# Patient Record
Sex: Female | Born: 1935 | Race: White | Hispanic: No | Marital: Married | Smoking: Never smoker
Health system: Southern US, Community
[De-identification: ages and names within clinical notes are randomized; demographics above are authoritative.]

## PROBLEM LIST (undated history)

## (undated) DIAGNOSIS — E559 Vitamin D deficiency, unspecified: Secondary | ICD-10-CM

## (undated) DIAGNOSIS — F0390 Unspecified dementia without behavioral disturbance: Secondary | ICD-10-CM

## (undated) DIAGNOSIS — R5383 Other fatigue: Secondary | ICD-10-CM

## (undated) DIAGNOSIS — E785 Hyperlipidemia, unspecified: Secondary | ICD-10-CM

## (undated) DIAGNOSIS — M549 Dorsalgia, unspecified: Secondary | ICD-10-CM

## (undated) DIAGNOSIS — I10 Essential (primary) hypertension: Secondary | ICD-10-CM

## (undated) DIAGNOSIS — D649 Anemia, unspecified: Secondary | ICD-10-CM

## (undated) HISTORY — PX: CHOLECYSTECTOMY: SHX55

## (undated) HISTORY — PX: ABDOMINAL HYSTERECTOMY: SHX81

---

## 2010-05-25 ENCOUNTER — Ambulatory Visit: Payer: Medicare Other | Attending: Family Medicine | Admitting: Physical Therapy

## 2010-05-25 DIAGNOSIS — M25619 Stiffness of unspecified shoulder, not elsewhere classified: Secondary | ICD-10-CM | POA: Insufficient documentation

## 2010-05-25 DIAGNOSIS — M25519 Pain in unspecified shoulder: Secondary | ICD-10-CM | POA: Insufficient documentation

## 2010-05-25 DIAGNOSIS — IMO0001 Reserved for inherently not codable concepts without codable children: Secondary | ICD-10-CM | POA: Insufficient documentation

## 2010-05-30 ENCOUNTER — Ambulatory Visit: Payer: Medicare Other | Attending: Family Medicine | Admitting: Physical Therapy

## 2010-05-30 DIAGNOSIS — IMO0001 Reserved for inherently not codable concepts without codable children: Secondary | ICD-10-CM | POA: Insufficient documentation

## 2010-05-30 DIAGNOSIS — M25619 Stiffness of unspecified shoulder, not elsewhere classified: Secondary | ICD-10-CM | POA: Insufficient documentation

## 2010-05-30 DIAGNOSIS — M25519 Pain in unspecified shoulder: Secondary | ICD-10-CM | POA: Insufficient documentation

## 2010-05-31 ENCOUNTER — Ambulatory Visit: Payer: Medicare Other

## 2010-06-06 ENCOUNTER — Ambulatory Visit: Payer: Medicare Other | Admitting: Physical Therapy

## 2010-06-08 ENCOUNTER — Ambulatory Visit: Payer: Medicare Other | Admitting: Physical Therapy

## 2010-06-13 ENCOUNTER — Ambulatory Visit: Payer: Medicare Other | Admitting: Physical Therapy

## 2010-06-15 ENCOUNTER — Ambulatory Visit: Payer: Medicare Other | Admitting: Physical Therapy

## 2011-12-12 ENCOUNTER — Encounter (HOSPITAL_BASED_OUTPATIENT_CLINIC_OR_DEPARTMENT_OTHER): Payer: Self-pay

## 2011-12-12 ENCOUNTER — Emergency Department (HOSPITAL_BASED_OUTPATIENT_CLINIC_OR_DEPARTMENT_OTHER)
Admission: EM | Admit: 2011-12-12 | Discharge: 2011-12-12 | Disposition: A | Discharge status: Home / Self Care | Payer: Medicare Other | Attending: Emergency Medicine | Admitting: Emergency Medicine

## 2011-12-12 DIAGNOSIS — Z79899 Other long term (current) drug therapy: Secondary | ICD-10-CM | POA: Insufficient documentation

## 2011-12-12 DIAGNOSIS — Z7982 Long term (current) use of aspirin: Secondary | ICD-10-CM | POA: Insufficient documentation

## 2011-12-12 DIAGNOSIS — K529 Noninfective gastroenteritis and colitis, unspecified: Secondary | ICD-10-CM

## 2011-12-12 DIAGNOSIS — F068 Other specified mental disorders due to known physiological condition: Secondary | ICD-10-CM | POA: Insufficient documentation

## 2011-12-12 DIAGNOSIS — R197 Diarrhea, unspecified: Secondary | ICD-10-CM | POA: Insufficient documentation

## 2011-12-12 DIAGNOSIS — Z8639 Personal history of other endocrine, nutritional and metabolic disease: Secondary | ICD-10-CM | POA: Insufficient documentation

## 2011-12-12 DIAGNOSIS — K5289 Other specified noninfective gastroenteritis and colitis: Secondary | ICD-10-CM | POA: Insufficient documentation

## 2011-12-12 DIAGNOSIS — E785 Hyperlipidemia, unspecified: Secondary | ICD-10-CM | POA: Insufficient documentation

## 2011-12-12 DIAGNOSIS — I10 Essential (primary) hypertension: Secondary | ICD-10-CM | POA: Insufficient documentation

## 2011-12-12 DIAGNOSIS — Z862 Personal history of diseases of the blood and blood-forming organs and certain disorders involving the immune mechanism: Secondary | ICD-10-CM | POA: Insufficient documentation

## 2011-12-12 HISTORY — DX: Vitamin D deficiency, unspecified: E55.9

## 2011-12-12 HISTORY — DX: Unspecified dementia, unspecified severity, without behavioral disturbance, psychotic disturbance, mood disturbance, and anxiety: F03.90

## 2011-12-12 HISTORY — DX: Anemia, unspecified: D64.9

## 2011-12-12 HISTORY — DX: Dorsalgia, unspecified: M54.9

## 2011-12-12 HISTORY — DX: Other fatigue: R53.83

## 2011-12-12 HISTORY — DX: Essential (primary) hypertension: I10

## 2011-12-12 HISTORY — DX: Hyperlipidemia, unspecified: E78.5

## 2011-12-12 LAB — URINE MICROSCOPIC-ADD ON

## 2011-12-12 LAB — BASIC METABOLIC PANEL
CO2: 24 mEq/L (ref 19–32)
Chloride: 95 mEq/L — ABNORMAL LOW (ref 96–112)
Creatinine, Ser: 0.5 mg/dL (ref 0.50–1.10)
Sodium: 134 mEq/L — ABNORMAL LOW (ref 135–145)

## 2011-12-12 LAB — URINALYSIS, ROUTINE W REFLEX MICROSCOPIC
Glucose, UA: 100 mg/dL — AB
pH: 8 (ref 5.0–8.0)

## 2011-12-12 LAB — CBC WITH DIFFERENTIAL/PLATELET
Basophils Absolute: 0 10*3/uL (ref 0.0–0.1)
HCT: 36.6 % (ref 36.0–46.0)
Lymphocytes Relative: 10 % — ABNORMAL LOW (ref 12–46)
Monocytes Absolute: 0.1 10*3/uL (ref 0.1–1.0)
Neutro Abs: 5.5 10*3/uL (ref 1.7–7.7)
RBC: 3.74 MIL/uL — ABNORMAL LOW (ref 3.87–5.11)
RDW: 10.9 % — ABNORMAL LOW (ref 11.5–15.5)
WBC: 6.3 10*3/uL (ref 4.0–10.5)

## 2011-12-12 MED ORDER — DIPHENOXYLATE-ATROPINE 2.5-0.025 MG PO TABS: 1.0000 | ORAL_TABLET | Freq: Four times a day (QID) | ORAL | Status: DC | PRN

## 2011-12-12 MED ORDER — ONDANSETRON 8 MG PO TBDP: 4.0000 mg | ORAL_TABLET | Freq: Three times a day (TID) | ORAL | Status: DC | PRN

## 2011-12-12 MED ORDER — SODIUM CHLORIDE 0.9 % IV BOLUS (SEPSIS)
500.0000 mL | Freq: Once | INTRAVENOUS | Status: DC
Start: 2011-12-12 — End: 2011-12-12

## 2011-12-12 MED ORDER — LACTATED RINGERS IV BOLUS (SEPSIS)
1000.0000 mL | Freq: Once | INTRAVENOUS | Status: AC
  Administered 2011-12-12: 1000 mL via INTRAVENOUS

## 2011-12-12 MED ORDER — SODIUM CHLORIDE 0.9 % IV BOLUS (SEPSIS): 1000.0000 mL | Freq: Once | INTRAVENOUS | Status: DC

## 2011-12-12 MED ORDER — DIPHENOXYLATE-ATROPINE 2.5-0.025 MG PO TABS
2.0000 | ORAL_TABLET | Freq: Once | ORAL | Status: AC
  Administered 2011-12-12: 2 via ORAL
  Filled 2011-12-12: qty 2

## 2011-12-12 NOTE — ED Provider Notes (Signed)
History     CSN: [id number]  Arrival date & time      First MD Initiated Contact with Patient 12/12/11 0338      Chief Complaint  Patient presents with  . Nausea, vomiting and diarrhea     (Consider location/radiation/quality/duration/timing/severity/associated sxs/prior treatment) HPI This is a 76 year old female with nausea, vomiting and diarrhea that began yesterday afternoon. She describes the severity as copious. The diarrhea is described as watery. She has not been able to keep anything on her stomach. She does have some abdominal cramping associated with this. She denies fever, chills or chest pain. She is having lightheadedness on standing and a dry mouth. She was given 4 mg of IV Zofran by EMS prior to arrival. She has not vomited since receiving the Zofran.  Past Medical History  Diagnosis Date  . Hyperlipemia   . Fatigue   . Anemia   . Hypertension   . Vitamin D deficiency disease   . Back pain   . Dementia     No past surgical history on file.  No family history on file.  History  Substance Use Topics  . Smoking status: Not on file  . Smokeless tobacco: Not on file  . Alcohol Use:     OB History    Grav Para Term Preterm Abortions TAB SAB Ect Mult Living                  Review of Systems  All other systems reviewed and are negative.    Allergies  Azithromycin and Penicillins  Home Medications   Current Outpatient Rx  Name  Route  Sig  Dispense  Refill  . ALENDRONATE SODIUM 70 MG PO TABS   Oral   Take 70 mg by mouth every 7 (seven) days. Take with a full glass of water on an empty stomach.         . ASPIRIN 81 MG PO TABS   Oral   Take 81 mg by mouth daily.         . ATORVASTATIN CALCIUM 40 MG PO TABS   Oral   Take 40 mg by mouth daily.         Marland Kitchen COENZYME Q10 30 MG PO CAPS   Oral   Take 30 mg by mouth 3 (three) times daily.         . DULOXETINE HCL 30 MG PO CPEP   Oral   Take 30 mg by mouth daily.         Marland Kitchen ESTRADIOL  2 MG PO TABS   Oral   Take 2 mg by mouth daily.         Marland Kitchen LANSOPRAZOLE 30 MG PO CPDR   Oral   Take 30 mg by mouth daily.         Marland Kitchen LISINOPRIL-HYDROCHLOROTHIAZIDE 20-25 MG PO TABS   Oral   Take 1 tablet by mouth daily.         Marland Kitchen METAXALONE 800 MG PO TABS   Oral   Take 800 mg by mouth 3 (three) times daily.         Marland Kitchen NAPROXEN 500 MG PO TABS   Oral   Take 500 mg by mouth 2 (two) times daily with a meal.         . TRAMADOL HCL 50 MG PO TABS   Oral   Take 50 mg by mouth 4 (four) times daily.           BP  146/68  Temp 94.4 F (34.7 C) (Rectal)  Resp 20  SpO2 100%  Physical Exam General: Well-developed, well-nourished female in no acute distress; appearance consistent with age of record HENT: normocephalic, atraumatic; dry mucous membranes Eyes: pupils equal round and reactive to light; extraocular muscles intact Neck: supple Heart: regular rate and rhythm Lungs: clear to auscultation bilaterally Abdomen: soft; nondistended; nontender; no masses or hepatosplenomegaly; bowel sounds present Extremities: No deformity; full range of motion; pulses normal; no edema Neurologic: Awake, alert; motor function intact in all extremities and symmetric; no facial droop Skin: Warm and dry Psychiatric: Normal mood and affect    ED Course  Procedures (including critical care time)     MDM   Nursing notes and vitals signs, including pulse oximetry, reviewed.  Summary of this visit's results, reviewed by myself:  Labs:  Results for orders placed during the hospital encounter of 12/12/11  CBC WITH DIFFERENTIAL      Component Value Range   WBC 6.3  4.0 - 10.5 K/uL   RBC 3.74 (*) 3.87 - 5.11 MIL/uL   Hemoglobin 12.9  12.0 - 15.0 g/dL   HCT 16.1  09.6 - 04.5 %   MCV 97.9  78.0 - 100.0 fL   MCH 34.5 (*) 26.0 - 34.0 pg   MCHC 35.2  30.0 - 36.0 g/dL   RDW 40.9 (*) 81.1 - 91.4 %   Platelets 179  150 - 400 K/uL   Neutrophils Relative 88 (*) 43 - 77 %   Neutro Abs  5.5  1.7 - 7.7 K/uL   Lymphocytes Relative 10 (*) 12 - 46 %   Lymphs Abs 0.6 (*) 0.7 - 4.0 K/uL   Monocytes Relative 2 (*) 3 - 12 %   Monocytes Absolute 0.1  0.1 - 1.0 K/uL   Eosinophils Relative 0  0 - 5 %   Eosinophils Absolute 0.0  0.0 - 0.7 K/uL   Basophils Relative 0  0 - 1 %   Basophils Absolute 0.0  0.0 - 0.1 K/uL  BASIC METABOLIC PANEL      Component Value Range   Sodium 134 (*) 135 - 145 mEq/L   Potassium 3.3 (*) 3.5 - 5.1 mEq/L   Chloride 95 (*) 96 - 112 mEq/L   CO2 24  19 - 32 mEq/L   Glucose, Bld 156 (*) 70 - 99 mg/dL   BUN 12  6 - 23 mg/dL   Creatinine, Ser 7.82  0.50 - 1.10 mg/dL   Calcium 8.9  8.4 - 95.6 mg/dL   GFR calc non Af Amer >90  >90 mL/min   GFR calc Af Amer >90  >90 mL/min  URINALYSIS, ROUTINE W REFLEX MICROSCOPIC      Component Value Range   Color, Urine YELLOW  YELLOW   APPearance CLOUDY (*) CLEAR   Specific Gravity, Urine 1.014  1.005 - 1.030   pH 8.0  5.0 - 8.0   Glucose, UA 100 (*) NEGATIVE mg/dL   Hgb urine dipstick NEGATIVE  NEGATIVE   Bilirubin Urine NEGATIVE  NEGATIVE   Ketones, ur 40 (*) NEGATIVE mg/dL   Protein, ur NEGATIVE  NEGATIVE mg/dL   Urobilinogen, UA 0.2  0.0 - 1.0 mg/dL   Nitrite NEGATIVE  NEGATIVE   Leukocytes, UA SMALL (*) NEGATIVE  URINE MICROSCOPIC-ADD ON      Component Value Range   Squamous Epithelial / LPF MANY (*) RARE   WBC, UA 3-6  <3 WBC/hpf   RBC / HPF 0-2  <3 RBC/hpf  Bacteria, UA FEW (*) RARE   Urine-Other AMORPHOUS URATES/PHOSPHATES      3:53 AM The patient was found to be wearing six 4.6 mg/day Exelon patches on arrival. She is supposed to be wearing 2 patches daily. These were removed as their cholinergic effect may be causing or exacerbating her symptoms.  4:54 AM Bair Hugger applied for rectal temperature of 94.4.  5:47 AM Temperature significantly improved.  6:33 AM Temperature is returned to normal. Patient is drinking fluids without emesis and has had no diarrhea since arrival. She does show some  confusion at this time consistent with sundowning associated with her documented dementia. Her husband has been advised to carefully monitor her usage of the Exelon patches, as these may have caused or exacerbated her symptoms.       Hanley Seamen, MD 12/12/11 [phone]

## 2011-12-12 NOTE — ED Notes (Signed)
Pt d/c to home spoke with husband concerning monitoring daily patches pt put on.Husband verbalized understanding

## 2011-12-12 NOTE — ED Notes (Signed)
Pt found with total of 6 Exelon patches on  when assessment was done.

## 2011-12-12 NOTE — ED Notes (Signed)
Pt having N/V/D,light headed when standing,dry mouth.

## 2011-12-13 LAB — URINE CULTURE

## 2012-11-26 ENCOUNTER — Encounter (HOSPITAL_BASED_OUTPATIENT_CLINIC_OR_DEPARTMENT_OTHER): Payer: Self-pay | Admitting: Emergency Medicine

## 2012-11-26 ENCOUNTER — Emergency Department (HOSPITAL_BASED_OUTPATIENT_CLINIC_OR_DEPARTMENT_OTHER)
Admission: EM | Admit: 2012-11-26 | Discharge: 2012-11-26 | Disposition: A | Discharge status: Home / Self Care | Payer: Medicare Other | Attending: Emergency Medicine | Admitting: Emergency Medicine

## 2012-11-26 DIAGNOSIS — F0390 Unspecified dementia without behavioral disturbance: Secondary | ICD-10-CM | POA: Insufficient documentation

## 2012-11-26 DIAGNOSIS — Z862 Personal history of diseases of the blood and blood-forming organs and certain disorders involving the immune mechanism: Secondary | ICD-10-CM | POA: Insufficient documentation

## 2012-11-26 DIAGNOSIS — E785 Hyperlipidemia, unspecified: Secondary | ICD-10-CM | POA: Insufficient documentation

## 2012-11-26 DIAGNOSIS — N390 Urinary tract infection, site not specified: Secondary | ICD-10-CM | POA: Insufficient documentation

## 2012-11-26 DIAGNOSIS — Z7982 Long term (current) use of aspirin: Secondary | ICD-10-CM | POA: Insufficient documentation

## 2012-11-26 DIAGNOSIS — Z880 Allergy status to penicillin: Secondary | ICD-10-CM | POA: Insufficient documentation

## 2012-11-26 DIAGNOSIS — I10 Essential (primary) hypertension: Secondary | ICD-10-CM | POA: Insufficient documentation

## 2012-11-26 DIAGNOSIS — Z79899 Other long term (current) drug therapy: Secondary | ICD-10-CM | POA: Insufficient documentation

## 2012-11-26 DIAGNOSIS — Z791 Long term (current) use of non-steroidal anti-inflammatories (NSAID): Secondary | ICD-10-CM | POA: Insufficient documentation

## 2012-11-26 LAB — URINALYSIS, ROUTINE W REFLEX MICROSCOPIC
Ketones, ur: NEGATIVE mg/dL
Nitrite: NEGATIVE
Specific Gravity, Urine: 1.005 (ref 1.005–1.030)
pH: 7 (ref 5.0–8.0)

## 2012-11-26 LAB — URINE MICROSCOPIC-ADD ON

## 2012-11-26 MED ORDER — SULFAMETHOXAZOLE-TMP DS 800-160 MG PO TABS: 1.0000 | ORAL_TABLET | Freq: Two times a day (BID) | ORAL | Status: DC

## 2012-11-26 MED ORDER — PHENAZOPYRIDINE HCL 200 MG PO TABS: 200.0000 mg | ORAL_TABLET | Freq: Three times a day (TID) | ORAL | Status: DC

## 2012-11-26 NOTE — ED Notes (Signed)
Pt reports pain when voiding and frequency.

## 2012-11-26 NOTE — ED Provider Notes (Signed)
CSN: [id number]     Arrival date & time 11/26/12  1478 History   First MD Initiated Contact with Patient 11/26/12 [phone]     Chief Complaint  Patient presents with  . Urinary Frequency   (Consider location/radiation/quality/duration/timing/severity/associated sxs/prior Treatment) Patient is a 77 y.o. female presenting with frequency. The history is provided by the patient.  Urinary Frequency This is a new problem. The current episode started more than 2 days ago. The problem occurs constantly. The problem has not changed since onset.Pertinent negatives include no abdominal pain and no headaches. Nothing aggravates the symptoms. Nothing relieves the symptoms. She has tried nothing for the symptoms.   patient has history of UTI and this is similar. Denies any flank pain or fever. No vaginal bleeding or discharge.  Past Medical History  Diagnosis Date  . Hyperlipemia   . Fatigue   . Anemia   . Hypertension   . Vitamin D deficiency disease   . Back pain   . Dementia    Past Surgical History  Procedure Laterality Date  . Cholecystectomy    . Abdominal hysterectomy     No family history on file. History  Substance Use Topics  . Smoking status: Never Smoker   . Smokeless tobacco: Not on file  . Alcohol Use: 0.6 oz/week    1 Glasses of wine per week     Comment: daily   OB History   Grav Para Term Preterm Abortions TAB SAB Ect Mult Living                 Review of Systems  Gastrointestinal: Negative for abdominal pain.  Genitourinary: Positive for frequency.  Neurological: Negative for headaches.  All other systems reviewed and are negative.    Allergies  Azithromycin and Penicillins  Home Medications   Current Outpatient Rx  Name  Route  Sig  Dispense  Refill  . alendronate (FOSAMAX) 70 MG tablet   Oral   Take 70 mg by mouth every 7 (seven) days. Take with a full glass of water on an empty stomach.         Marland Kitchen aspirin 81 MG tablet   Oral   Take 81 mg by mouth  daily.         Marland Kitchen atorvastatin (LIPITOR) 40 MG tablet   Oral   Take 40 mg by mouth daily.         Marland Kitchen co-enzyme Q-10 30 MG capsule   Oral   Take 30 mg by mouth 3 (three) times daily.         . diphenoxylate-atropine (LOMOTIL) 2.5-0.025 MG per tablet   Oral   Take 1 tablet by mouth 4 (four) times daily as needed for diarrhea or loose stools.   16 tablet   0   . DULoxetine (CYMBALTA) 30 MG capsule   Oral   Take 30 mg by mouth daily.         Marland Kitchen estradiol (ESTRACE) 2 MG tablet   Oral   Take 2 mg by mouth daily.         . lansoprazole (PREVACID) 30 MG capsule   Oral   Take 30 mg by mouth daily.         Marland Kitchen lisinopril-hydrochlorothiazide (PRINZIDE,ZESTORETIC) 20-25 MG per tablet   Oral   Take 1 tablet by mouth daily.         . metaxalone (SKELAXIN) 800 MG tablet   Oral   Take 800 mg by mouth 3 (three) times daily.         Marland Kitchen  naproxen (NAPROSYN) 500 MG tablet   Oral   Take 500 mg by mouth 2 (two) times daily with a meal.         . ondansetron (ZOFRAN ODT) 8 MG disintegrating tablet   Oral   Take 0.5 tablets (4 mg total) by mouth every 8 (eight) hours as needed for nausea.   5 tablet   0   . traMADol (ULTRAM) 50 MG tablet   Oral   Take 50 mg by mouth 4 (four) times daily.          BP 143/79  Pulse 101  Temp(Src) 98 F (36.7 C) (Oral)  Resp 18  Ht 5\' 4"  (1.626 m)  Wt 130 lb (58.968 kg)  BMI 22.3 kg/m2  SpO2 98% Physical Exam  Nursing note and vitals reviewed. Constitutional: She is oriented to person, place, and time. She appears well-developed and well-nourished.  Non-toxic appearance. No distress.  HENT:  Head: Normocephalic and atraumatic.  Eyes: Conjunctivae, EOM and lids are normal. Pupils are equal, round, and reactive to light.  Neck: Normal range of motion. Neck supple. No tracheal deviation present. No mass present.  Cardiovascular: Normal rate, regular rhythm and normal heart sounds.  Exam reveals no gallop.   No murmur  heard. Pulmonary/Chest: Effort normal and breath sounds normal. No stridor. No respiratory distress. She has no decreased breath sounds. She has no wheezes. She has no rhonchi. She has no rales.  Abdominal: Soft. Normal appearance and bowel sounds are normal. She exhibits no distension. There is no tenderness. There is no rigidity, no rebound, no guarding and no CVA tenderness.  Musculoskeletal: Normal range of motion. She exhibits no edema and no tenderness.  Neurological: She is alert and oriented to person, place, and time. She has normal strength. No cranial nerve deficit or sensory deficit. GCS eye subscore is 4. GCS verbal subscore is 5. GCS motor subscore is 6.  Skin: Skin is warm and dry. No abrasion and no rash noted.  Psychiatric: She has a normal mood and affect. Her speech is normal and behavior is normal.    ED Course  Procedures (including critical care time) Labs Review Labs Reviewed  URINE CULTURE  URINALYSIS, ROUTINE W REFLEX MICROSCOPIC   Imaging Review No results found.  EKG Interpretation   None       MDM  No diagnosis found. Patient to be placed on Keflex and Pyridium for her urinary tract infection    Toy Baker, MD 11/26/12 [phone]

## 2012-11-27 LAB — URINE CULTURE
Colony Count: NO GROWTH
Culture: NO GROWTH

## 2014-02-11 ENCOUNTER — Ambulatory Visit (INDEPENDENT_AMBULATORY_CARE_PROVIDER_SITE_OTHER): Payer: Medicare Other | Admitting: Neurology

## 2014-02-11 ENCOUNTER — Telehealth: Payer: Self-pay | Admitting: Neurology

## 2014-02-11 ENCOUNTER — Encounter: Payer: Self-pay | Admitting: Neurology

## 2014-02-11 VITALS — BP 135/72 | HR 77 | Ht 61.0 in | Wt 134.5 lb

## 2014-02-11 DIAGNOSIS — F0390 Unspecified dementia without behavioral disturbance: Secondary | ICD-10-CM | POA: Insufficient documentation

## 2014-02-11 NOTE — Patient Instructions (Signed)
MRI,  Return to clinic visit in 3 weeks

## 2014-02-11 NOTE — Telephone Encounter (Signed)
Laboratory result from her primary care physician

## 2014-02-11 NOTE — Telephone Encounter (Signed)
Dr.Yan labs in your in box for review.

## 2014-02-11 NOTE — Progress Notes (Signed)
PATIENT: Rachel Holloway DOB: 09/27/1935  HISTORICAL  Rachel Holloway 79 yo RH, is accompanied by her husband, referred by her primary care physician Dr. 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79, moved [place] to [place] in 2013 to be close to her children, family noticed gradual onset memory trouble since 2014, she tends to repeat herself, getting worse, she enjoys walking her dogs, working on crossword puzzles has chronic low back pain, mild cautious gait  She has quit driving since summer of [id number], using CPAP machine, which has helped her sleep quality, she denied loss sense of smells, no REM sleep disorder, no hallucinations,   There was no family history of dementia   REVIEW OF SYSTEMS: Full 14 system review of systems performed and notable only for memory loss, confusion  ALLERGIES: Allergies  Allergen Reactions  . Azithromycin   . Codeine Rash  . Erythromycin Nausea And Vomiting  . Penicillins Rash    HOME MEDICATIONS: Current Outpatient Prescriptions on File Prior to Visit  Medication Sig Dispense Refill  . alendronate (FOSAMAX) 70 MG tablet Take 70 mg by mouth every 7 (seven) days. Take with a full glass of water on an empty stomach.    Marland Kitchen. aspirin 81 MG tablet Take 81 mg by mouth daily.    Marland Kitchen. atorvastatin (LIPITOR) 40 MG tablet Take 40 mg by mouth daily.    . diphenoxylate-atropine (LOMOTIL) 2.5-0.025 MG per tablet Take 1 tablet by mouth 4 (four) times daily as needed for diarrhea or loose stools. 16 tablet 0  . DULoxetine (CYMBALTA) 30 MG capsule Take 60 mg by mouth daily. Per patient 60 mg. qd    . estradiol (ESTRACE) 2 MG tablet Take 2 mg by mouth daily.    . lansoprazole (PREVACID) 30 MG capsule Take 30 mg by mouth daily.    Marland Kitchen. lisinopril-hydrochlorothiazide (PRINZIDE,ZESTORETIC) 20-25 MG per tablet Take 1 tablet by mouth daily.    . metaxalone (SKELAXIN) 800 MG tablet Take 800 mg by mouth 3 (three) times daily.    . naproxen  (NAPROSYN) 500 MG tablet Take 500 mg by mouth 2 (two) times daily with a meal.    . ondansetron (ZOFRAN ODT) 8 MG disintegrating tablet Take 0.5 tablets (4 mg total) by mouth every 8 (eight) hours as needed for nausea. 5 tablet 0  . phenazopyridine (PYRIDIUM) 200 MG tablet Take 1 tablet (200 mg total) by mouth 3 (three) times daily. 6 tablet 0   No current facility-administered medications on file prior to visit.    PAST MEDICAL HISTORY: Past Medical History  Diagnosis Date  . Hyperlipemia   . Fatigue   . Anemia   . Hypertension   . Vitamin D deficiency disease   . Back pain   . Dementia     PAST SURGICAL HISTORY: Past Surgical History  Procedure Laterality Date  . Cholecystectomy    . Abdominal hysterectomy      FAMILY HISTORY: Family History  Problem Relation Age of Onset  . Heart Problems Father   . Pulmonary embolism Father   . Heart Problems Mother     SOCIAL HISTORY:  History   Social History  . Marital Status: Married    Spouse Name: Rachel Holloway    Number of Children: 2  . Years of Education: RN   Occupational History  .      Retired Charity fundraiserN   Social History Main Topics  . Smoking status: Never Smoker   .  Smokeless tobacco: Never Used  . Alcohol Use: 0.6 oz/week    1 Glasses of wine per week     Comment: daily  . Drug Use: No  . Sexual Activity: Not on file   Other Topics Concern  . Not on file   Social History Narrative   Patient lives at home with her husband Rachel Sexton).   Retired Art therapist.   Right handed.   Caffeine two cups of coffee daily.     PHYSICAL EXAM   Filed Vitals:   02/11/14 1109  BP: 135/72  Pulse: 77  Height:  (1.549 m)  Weight: 134 lb 8 oz (61.009 kg)    Not recorded      Body mass index is 25.43 kg/(m^2).   Generalized: In no acute distress  Neck: Supple, no carotid bruits   Cardiac: Regular rate rhythm  Pulmonary: Clear to auscultation bilaterally  Musculoskeletal: No deformity  Neurological  examination  Mentation: Alert oriented to time, place, history taking, and causual conversation, Mini-Mental Status Examination is 19 out of 30, she missed 3 out of 3 recalls, is not oriented to place, and time, palmomental signs  Cranial nerve II-XII: Pupils were equal round reactive to light. Extraocular movements were full.  Visual field were full on confrontational test. Bilateral fundi were sharp.  Facial sensation and strength were normal. Hearing was intact to finger rubbing bilaterally. Uvula tongue midline.  Head turning and shoulder shrug and were normal and symmetric.Tongue protrusion into cheek strength was normal.  Motor: Normal tone, bulk and strength.  Sensory: Intact to fine touch, pinprick, preserved vibratory sensation, and proprioception at toes.  Coordination: Normal finger to nose, heel-to-shin bilaterally there was no truncal ataxia  Gait: Rising up from seated position without assistance, stoop forward posturing, cautious,   Romberg signs: Negative  Deep tendon reflexes: Brachioradialis 2/2, biceps 2/2, triceps 2/2, patellar 2/2, Achilles 2/2, plantar responses were flexor bilaterally.   DIAGNOSTIC DATA (LABS, IMAGING, TESTING) - I reviewed patient records, labs, notes, testing and imaging myself where available.  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 Complete evaluation with MRI of brain 2. Laboratory evaluation from primary care's office   Orders Placed This Encounter  Procedures  . MR Brain Wo Contrast    Return in about 3 weeks (around 03/04/2014).  Levert Feinstein, M.D. Ph.D.  [facility] [address], [address] [place], [place] [address] [phone]

## 2014-02-25 ENCOUNTER — Ambulatory Visit
Admission: RE | Admit: 2014-02-25 | Discharge: 2014-02-25 | Disposition: A | Discharge status: Home / Self Care | Payer: Medicare Other | Source: Ambulatory Visit | Attending: Neurology | Admitting: Neurology

## 2014-02-25 DIAGNOSIS — F0390 Unspecified dementia without behavioral disturbance: Secondary | ICD-10-CM

## 2014-02-26 ENCOUNTER — Telehealth: Payer: Self-pay | Admitting: Neurology

## 2014-02-26 NOTE — Telephone Encounter (Signed)
Rachel Holloway, please let patient know, MRI showed age-related chronic changes, will go over detail in her follow-up February fourth 2016  Abnormal MRI brain (without) demonstrating: 1. Moderate perisylvian and severe mesial temporal atrophy. Mild ventriculomegaly on ex vacuo basis. 2. Mild chronic small vessel ischemic disease.  3. No acute findings.  4. No change from MRI on 08/20/13

## 2014-02-26 NOTE — Telephone Encounter (Signed)
Spoke to [place] (husband, on HIPPA) - he understood MRI results and she will keep her follow up to further discuss.

## 2014-03-04 ENCOUNTER — Ambulatory Visit (INDEPENDENT_AMBULATORY_CARE_PROVIDER_SITE_OTHER): Payer: Medicare Other | Admitting: Neurology

## 2014-03-04 ENCOUNTER — Encounter: Payer: Self-pay | Admitting: Neurology

## 2014-03-04 ENCOUNTER — Telehealth: Payer: Self-pay | Admitting: Neurology

## 2014-03-04 VITALS — BP 128/68 | HR 78 | Ht 61.0 in | Wt 136.0 lb

## 2014-03-04 DIAGNOSIS — M545 Low back pain, unspecified: Secondary | ICD-10-CM | POA: Insufficient documentation

## 2014-03-04 DIAGNOSIS — R269 Unspecified abnormalities of gait and mobility: Secondary | ICD-10-CM

## 2014-03-04 DIAGNOSIS — F0390 Unspecified dementia without behavioral disturbance: Secondary | ICD-10-CM

## 2014-03-04 MED ORDER — MEMANTINE HCL 10 MG PO TABS: 10.0000 mg | ORAL_TABLET | Freq: Two times a day (BID) | ORAL | Status: DC

## 2014-03-04 NOTE — Progress Notes (Signed)
PATIENT: Rachel Holloway DOB: 03/20/1935  HISTORICAL  Rachel Holloway 79 yo RH, is accompanied by her husband, referred by her primary care physician Dr. 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79, [place] in 2013 to be close to her children, family noticed gradual onset memory trouble since 2014, she tends to repeat herself, getting worse, she enjoys walking her dogs, working on crossword puzzles has chronic low back pain, mild cautious gait  She has quit driving since summer of [id number], using CPAP machine, which has helped her sleep quality, she denied loss sense of smells, no REM sleep disorder, no hallucinations,   There was no family history of dementia  UPDATE Feb 4th 2016: She is accompanied by her husband, and daughter at today's clinical visit, family reported that over the past 1 year since 2015, she has gradual onset small shuffling gait, bending forward, she denied REM sleep disorder, she had a few years history of decreased sense of smell, she walks her dog every day, has frequent UTI, urinary urgency,   We have reviewed MRI of brain, mild to moderate generalized atrophy, mild small vessel disease,   I have reviewed laboratory evaluation from her primary care's office, February 03 2014, elevated LDL 127, normal vitamin D 32, CMP, creatinine 1.0, CBC showed mildly low WBC 3.8, elevated MCV 104,   REVIEW OF SYSTEMS: Full 14 system review of systems performed and notable only for memory loss, confusion  ALLERGIES: Allergies  Allergen Reactions  . Azithromycin   . Codeine Rash  . Erythromycin Nausea And Vomiting  . Penicillins Rash    HOME MEDICATIONS: Current Outpatient Prescriptions on File Prior to Visit  Medication Sig Dispense Refill  . alendronate (FOSAMAX) 70 MG tablet Take 70 mg by mouth every 7 (seven) days. Take with a full glass of water on an empty stomach.    Marland Kitchen. aspirin 81 MG tablet Take 81 mg by mouth daily.      Marland Kitchen. atorvastatin (LIPITOR) 40 MG tablet Take 40 mg by mouth daily.    . diphenoxylate-atropine (LOMOTIL) 2.5-0.025 MG per tablet Take 1 tablet by mouth 4 (four) times daily as needed for diarrhea or loose stools. 16 tablet 0  . DULoxetine (CYMBALTA) 30 MG capsule Take 60 mg by mouth daily. Per patient 60 mg. qd    . estradiol (ESTRACE) 2 MG tablet Take 2 mg by mouth daily.    . lansoprazole (PREVACID) 30 MG capsule Take 30 mg by mouth daily.    Marland Kitchen. lisinopril-hydrochlorothiazide (PRINZIDE,ZESTORETIC) 20-25 MG per tablet Take 1 tablet by mouth daily.    . metaxalone (SKELAXIN) 800 MG tablet Take 800 mg by mouth 3 (three) times daily.    . naproxen (NAPROSYN) 500 MG tablet Take 500 mg by mouth 2 (two) times daily with a meal.    . ondansetron (ZOFRAN ODT) 8 MG disintegrating tablet Take 0.5 tablets (4 mg total) by mouth every 8 (eight) hours as needed for nausea. 5 tablet 0   No current facility-administered medications on file prior to visit.    PAST MEDICAL HISTORY: Past Medical History  Diagnosis Date  . Hyperlipemia   . Fatigue   . Anemia   . Hypertension   . Vitamin D deficiency disease   . Back pain   . Dementia     PAST SURGICAL HISTORY: Past Surgical History  Procedure Laterality Date  . Cholecystectomy    . Abdominal hysterectomy  FAMILY HISTORY: Family History  Problem Relation Age of Onset  . Heart Problems Father   . Pulmonary embolism Father   . Heart Problems Mother     SOCIAL HISTORY:  History   Social History  . Marital Status: Married    Spouse Name: Rayna Sexton    Number of Children: 2  . Years of Education: RN   Occupational History  .      Retired Charity fundraiser   Social History Main Topics  . Smoking status: Never Smoker   . Smokeless tobacco: Never Used  . Alcohol Use: 0.6 oz/week    1 Glasses of wine per week     Comment: daily  . Drug Use: No  . Sexual Activity: Not on file   Other Topics Concern  . Not on file   Social History Narrative    Patient lives at home with her husband Rayna Sexton).   Retired Art therapist.   Right handed.   Caffeine two cups of coffee daily.     PHYSICAL EXAM   Filed Vitals:   03/04/14 1015  BP: 128/68  Pulse: 78  Height:  (1.549 m)  Weight: 136 lb (61.689 kg)    Not recorded      Body mass index is 25.71 kg/(m^2).   Generalized: In no acute distress  Neck: Supple, no carotid bruits   Cardiac: Regular rate rhythm  Pulmonary: Clear to auscultation bilaterally  Musculoskeletal: No deformity  Neurological examination  Mentation: Alert oriented to time, place, history taking, and causual conversation, Mini-Mental Status Examination is  23 out of 30,  she is not oriented to year, place, she missed 3 out of 3 recalls, is not oriented to place, and time, palmomental signs, Animal naming was 8   Cranial nerve II-XII: Pupils were equal round reactive to light. Extraocular movements were full.  Visual field were full on confrontational test. Bilateral fundi were sharp.  Facial sensation and strength were normal. Hearing was intact to finger rubbing bilaterally. Uvula tongue midline.  Head turning and shoulder shrug and were normal and symmetric.Tongue protrusion into cheek strength was normal.  Motor:  she has no resting tremor, right more than left limb, and mild nuchal rigidity, early fatigability on repeat movement,   Sensory: Intact to fine touch, pinprick, preserved vibratory sensation, and proprioception at toes.  Coordination: Normal finger to nose, heel-to-shin bilaterally there was no truncal ataxia  Gait: Rising up from seated position without assistance, stoop forward posturing, cautious, Decreased stride   Romberg signs: Negative  Deep tendon reflexes: Brachioradialis 2/2, biceps 2/2, triceps 2/2, patellar 2/2, Achilles 2/2, plantar responses were flexor bilaterally.   DIAGNOSTIC DATA (LABS, IMAGING, TESTING) - I reviewed patient records, labs, notes, testing  and imaging myself where available.  Lab Results  Component Value Date   WBC 6.3 12/12/2011   HGB 12.9 12/12/2011   HCT 36.6 12/12/2011   MCV 97.9 12/12/2011   PLT 179 12/12/2011      Component Value Date/Time   NA 134* 12/12/2011 0345   K 3.3* 12/12/2011 0345   CL 95* 12/12/2011 0345   CO2 24 12/12/2011 0345   GLUCOSE 156* 12/12/2011 0345   BUN 12 12/12/2011 0345   CREATININE 0.50 12/12/2011 0345   CALCIUM 8.9 12/12/2011 0345   GFRNONAA >90 12/12/2011 0345   GFRAA >90 12/12/2011 0345    ASSESSMENT AND PLAN  Liesel Peckenpaugh is a 79 y.o. female retired Engineer, civil (consulting), presenting with few years history of gradual onset memory  loss, Mini-Mental Status Examination  is 23 out of 30, she also has mild parkinsonian symptoms, MRI of the brain showed generalized atrophy, small vessel disease,  Most consistent with central nervous system degenerative disorder, Alzheimer's disease versus Lewy body dementia   laboratory evaluation, B12, TSH   start Namenda 10 mg twice a day  Orders Placed This Encounter  Procedures  . Vitamin B12  . TSH    New Prescriptions   MEMANTINE (NAMENDA) 10 MG TABLET    Take 1 tablet (10 mg total) by mouth 2 (two) times daily.    There are no discontinued medications.  Return in about 6 months (around 09/02/2014). Levert Feinstein, M.D. Ph.D.  [facility] [address], [address] [place], [place] [address] [phone]

## 2014-03-04 NOTE — Telephone Encounter (Signed)
Get lab result from her PCP

## 2014-03-04 NOTE — Telephone Encounter (Signed)
Called Dr. Louis MeckelParuchuri - they will fax over her labs.

## 2014-03-05 LAB — VITAMIN B12: VITAMIN B 12: 495 pg/mL (ref 211–946)

## 2014-03-05 LAB — TSH: TSH: 0.881 u[IU]/mL (ref 0.450–4.500)

## 2014-03-05 NOTE — Progress Notes (Signed)
Quick Note:  Pt aware that lab work in normal. ______

## 2014-03-05 NOTE — Progress Notes (Signed)
Quick Note:  Please call patient for normal B12, TSH ______

## 2014-03-22 ENCOUNTER — Telehealth: Payer: Self-pay | Admitting: Neurology

## 2014-03-22 NOTE — Telephone Encounter (Signed)
Patient's husband would like to let you know that he did find the medication you spoke with him about today. (Message only. Does not require call-back) Thanks!

## 2014-03-22 NOTE — Telephone Encounter (Signed)
Called [facility] - rx was received and ready - pt and husband aware.

## 2014-03-22 NOTE — Telephone Encounter (Signed)
Pt's husband is calling stating he wants to talk with the nurse about his wife's medication.  He would not tell me what medication or why, he just stated he wants the nurse to call him and he will discuss the details with her.  Please call and advise.

## 2014-09-08 ENCOUNTER — Ambulatory Visit (INDEPENDENT_AMBULATORY_CARE_PROVIDER_SITE_OTHER): Payer: Medicare Other | Admitting: Neurology

## 2014-09-08 ENCOUNTER — Encounter: Payer: Self-pay | Admitting: Neurology

## 2014-09-08 VITALS — BP 151/75 | HR 74 | Ht 61.0 in | Wt 138.0 lb

## 2014-09-08 DIAGNOSIS — R269 Unspecified abnormalities of gait and mobility: Secondary | ICD-10-CM | POA: Diagnosis not present

## 2014-09-08 DIAGNOSIS — M545 Low back pain: Secondary | ICD-10-CM | POA: Diagnosis not present

## 2014-09-08 DIAGNOSIS — F0390 Unspecified dementia without behavioral disturbance: Secondary | ICD-10-CM | POA: Diagnosis not present

## 2014-09-08 MED ORDER — MEMANTINE HCL 10 MG PO TABS: 10.0000 mg | ORAL_TABLET | Freq: Two times a day (BID) | ORAL | Status: DC

## 2014-09-08 NOTE — Progress Notes (Signed)
Chief Complaint  Patient presents with  . Dementia    MMSE 21/30 - 7 animals.  She is here with her husband, Rayna Sexton and her daughter, Waynetta Sandy.  Her family is unsure if she is taking her memory medication.  I called the pharmacy who informed us that both Exelon and Namenda have not been filled since March 2016.      PATIENT: Rachel Holloway DOB: 07/06/1935  HISTORICAL  Rachel Holloway 79 yo RH, is accompanied by her husband, referred by her primary care physician Dr. 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74, moved from [place] to [place] in 2013 to be close to her children, family noticed gradual onset memory trouble since 2014, she tends to repeat herself, getting worse over the past few years, she enjoys walking her dogs, working on crossword puzzles has chronic low back pain, mild cautious gait  She has quit driving since summer of 0981, using CPAP machine, which has helped her sleep quality, she denied loss sense of smells, no REM sleep disorder, no hallucinations,   There was no family history of dementia  UPDATE Feb 4th 2016: She is accompanied by her husband, and daughter at today's clinical visit, family reported that since 2015, she has gradual onset small shuffling gait, bending forward, she denied REM sleep disorder, she had a few years history of decreased sense of smell, she walks her dog every day, has frequent UTI, urinary urgency, Mini-Mental Status Examination is 23 out of 30  We have reviewed MRI of brain, mild to moderate generalized atrophy, mild small vessel disease,   I have reviewed laboratory evaluation from her primary care's office, February 03 2014, elevated LDL 127, normal vitamin D 32, CMP, creatinine 1.0, CBC showed mildly low WBC 3.8, elevated MCV 104,   UPDATE August 10th 2016: Her memory continues to get worse. Today's Mini-Mental Status Examination is 21 out of 30, with animal naming 7. She sleeps well, using CPAP machine, has good  appetite, but both she and her husband are confused about her medications, last refill of Namenda was March 2016, she denied REM sleep disorder, no loss of smell, mild small shuffling unsteady gait  REVIEW OF SYSTEMS: Full 14 system review of systems performed and notable only for apnea, memory loss  ALLERGIES: Allergies  Allergen Reactions  . Azithromycin   . Codeine Rash  . Erythromycin Nausea And Vomiting  . Penicillins Rash    HOME MEDICATIONS: Current Outpatient Prescriptions on File Prior to Visit  Medication Sig Dispense Refill  . alendronate (FOSAMAX) 70 MG tablet Take 70 mg by mouth every 7 (seven) days. Take with a full glass of water on an empty stomach.    Marland Kitchen aspirin 81 MG tablet Take 81 mg by mouth daily.    Marland Kitchen atorvastatin (LIPITOR) 40 MG tablet Take 40 mg by mouth daily.    . diphenoxylate-atropine (LOMOTIL) 2.5-0.025 MG per tablet Take 1 tablet by mouth 4 (four) times daily as needed for diarrhea or loose stools. 16 tablet 0  . DULoxetine (CYMBALTA) 30 MG capsule Take 60 mg by mouth daily. Per patient 60 mg. qd    . estradiol (ESTRACE) 2 MG tablet Take 2 mg by mouth daily.    . lansoprazole (PREVACID) 30 MG capsule Take 30 mg by mouth daily.    Marland Kitchen lisinopril-hydrochlorothiazide (PRINZIDE,ZESTORETIC) 20-25 MG per tablet Take 1 tablet by mouth daily.    . memantine (NAMENDA) 10 MG tablet Take 1 tablet (10  mg total) by mouth 2 (two) times daily. 60 tablet 11  . metaxalone (SKELAXIN) 800 MG tablet Take 800 mg by mouth 3 (three) times daily.    . naproxen (NAPROSYN) 500 MG tablet Take 500 mg by mouth 2 (two) times daily with a meal.    . ondansetron (ZOFRAN ODT) 8 MG disintegrating tablet Take 0.5 tablets (4 mg total) by mouth every 8 (eight) hours as needed for nausea. 5 tablet 0   No current facility-administered medications on file prior to visit.    PAST MEDICAL HISTORY: Past Medical History  Diagnosis Date  . Hyperlipemia   . Fatigue   . Anemia   . Hypertension   .  Vitamin D deficiency disease   . Back pain   . Dementia     PAST SURGICAL HISTORY: Past Surgical History  Procedure Laterality Date  . Cholecystectomy    . Abdominal hysterectomy      FAMILY HISTORY: Family History  Problem Relation Age of Onset  . Heart Problems Father   . Pulmonary embolism Father   . Heart Problems Mother     SOCIAL HISTORY:  Social History   Social History  . Marital Status: Married    Spouse Name: Rayna Sexton  . Number of Children: 2  . Years of Education: RN   Occupational History  .      Retired Charity fundraiser   Social History Main Topics  . Smoking status: Never Smoker   . Smokeless tobacco: Never Used  . Alcohol Use: 0.6 oz/week    1 Glasses of wine per week     Comment: daily  . Drug Use: No  . Sexual Activity: Not on file   Other Topics Concern  . Not on file   Social History Narrative   Patient lives at home with her husband Rayna Sexton).   Retired Art therapist.   Right handed.   Caffeine two cups of coffee daily.     PHYSICAL EXAM   Filed Vitals:   09/08/14 1027  BP: 151/75  Pulse: 74  Height: 5\' 1"  (1.549 m)  Weight: 138 lb (62.596 kg)    Not recorded      Body mass index is 26.09 kg/(m^2).  PHYSICAL EXAMNIATION:  Gen: NAD, conversant, well nourised, obese, well groomed                     Cardiovascular: Regular rate rhythm, no peripheral edema, warm, nontender. Eyes: Conjunctivae clear without exudates or hemorrhage Neck: Supple, no carotid bruise. Pulmonary: Clear to auscultation bilaterally   NEUROLOGICAL EXAM:  MENTAL STATUS: Speech:    Speech is normal; fluent and spontaneous with normal comprehension.  Cognition: Mini-Mental Status Examination is 21 out of 30, animal naming was 7     She is not oriented to date, year, day, Dr., city, county     Recent and remote memory: Missed 3 out of 3 recalls     Normal Attention span and concentration     Normal Language, naming, repeating,spontaneous speech     Fund  of knowledge   CRANIAL NERVES: CN II: Visual fields are full to confrontation. Fundoscopic exam is normal with sharp discs and no vascular changes. Pupils are round equal and briskly reactive to light. CN III, IV, VI: extraocular movement are normal. No ptosis. CN V: Facial sensation is intact to pinprick in all 3 divisions bilaterally. Corneal responses are intact.  CN VII: Face is symmetric with normal eye closure and smile.  CN VIII: Hearing is normal to rubbing fingers CN IX, X: Palate elevates symmetrically. Phonation is normal. CN XI: Head turning and shoulder shrug are intact CN XII: Tongue is midline with normal movements and no atrophy.  MOTOR: There is no pronator drift of out-stretched arms. Muscle bulk and tone are normal. Muscle strength is normal.  REFLEXES: Reflexes are 2+ and symmetric at the biceps, triceps, knees, and ankles. Plantar responses are flexor.  SENSORY: Intact to light touch, pinprick, position sense, and vibration sense are intact in fingers and toes.  COORDINATION: Rapid alternating movements and fine finger movements are intact. There is no dysmetria on finger-to-nose and heel-knee-shin.    GAIT/STANCE: She needs to push up to get up from seated position, wearing a lumbar brace, guarding her lumbar paraspinal muscles, wide-based, cautious gait,     DIAGNOSTIC DATA (LABS, IMAGING, TESTING) - I reviewed patient records, labs, notes, testing and imaging myself where available.  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 Ph.D.  [facility] [address] [place], [place] [address] [phone]

## 2015-03-14 ENCOUNTER — Ambulatory Visit (INDEPENDENT_AMBULATORY_CARE_PROVIDER_SITE_OTHER): Payer: Medicare Other | Admitting: Neurology

## 2015-03-14 ENCOUNTER — Encounter: Payer: Self-pay | Admitting: Neurology

## 2015-03-14 VITALS — BP 142/72 | HR 68 | Ht 61.0 in | Wt 143.0 lb

## 2015-03-14 DIAGNOSIS — R269 Unspecified abnormalities of gait and mobility: Secondary | ICD-10-CM

## 2015-03-14 DIAGNOSIS — F0390 Unspecified dementia without behavioral disturbance: Secondary | ICD-10-CM | POA: Diagnosis not present

## 2015-03-14 DIAGNOSIS — M545 Low back pain, unspecified: Secondary | ICD-10-CM

## 2015-03-14 MED ORDER — DONEPEZIL HCL 10 MG PO TABS: 10.0000 mg | ORAL_TABLET | Freq: Every day | ORAL | Status: DC

## 2015-03-14 MED ORDER — MEMANTINE HCL 10 MG PO TABS: 10.0000 mg | ORAL_TABLET | Freq: Two times a day (BID) | ORAL | Status: DC

## 2015-03-14 NOTE — Progress Notes (Signed)
Chief Complaint  Patient presents with  . Dementia    MMSE 19/30 - 8 animals.  She is here with her husband, Rayna Sexton, who feels her memory has further declined.      PATIENT: Rachel Holloway DOB: 20-Apr-1935  HISTORICAL  Rachel Holloway 80 yo RH, is accompanied by her husband, referred by her primary care physician Dr. 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33, moved from [place] to [place] in 2013 to be close to her children, family noticed gradual onset memory trouble since 2014, she tends to repeat herself, getting worse over the past few years, she enjoys walking her dogs, working on crossword puzzles, she has chronic low back pain, mild cautious gait  She has quit driving since summer of 1610, using CPAP machine, which has helped her sleep quality, she denied loss sense of smells, no REM sleep disorder, no hallucinations,   There was no family history of dementia  UPDATE Feb 4th 2016: She is accompanied by her husband, and daughter at today's clinical visit, family reported that since 2015, she has gradual onset small shuffling gait, bending forward, she denied REM sleep disorder, she had a few years history of decreased sense of smell, she walks her dog every day, has frequent UTI, urinary urgency, Mini-Mental Status Examination is 23 out of 30  We have reviewed MRI of brain, mild to moderate generalized atrophy, mild small vessel disease,   I have reviewed laboratory evaluation from her primary care's office, February 03 2014, elevated LDL 127, normal vitamin D 32, CMP, creatinine 1.0, CBC showed mildly low WBC 3.8, elevated MCV 104,   UPDATE August 10th 2016: Her memory continues to get worse. Today's Mini-Mental Status Examination is 21 out of 30, with animal naming 7. She sleeps well, using CPAP machine, has good appetite, but both she and her husband are confused about her medications, last refill of Namenda was March 2016, she denied REM sleep disorder,  no loss of smell, mild small shuffling unsteady gait  UPDATE Mar 14 2015: She is accompanied by her husband at today's clinical visit, reported she has worsening memory trouble, she can tolerate Namenda 10 mg twice a day well, use CPAP machine sleep well at daytime, good appetite, chronic low back pain, wear lumbar brace, mild gait difficulty, use walker, she denies bowel and bladder incontinence. There is no significant behavior issues.  REVIEW OF SYSTEMS: Full 14 system review of systems performed and notable only for apnea, memory loss  ALLERGIES: Allergies  Allergen Reactions  . Azithromycin   . Codeine Rash  . Erythromycin Nausea And Vomiting  . Penicillins Rash    HOME MEDICATIONS: Current Outpatient Prescriptions on File Prior to Visit  Medication Sig Dispense Refill  . aspirin 81 MG tablet Take 81 mg by mouth daily.    Marland Kitchen atorvastatin (LIPITOR) 80 MG tablet 80 mg daily.     . DULoxetine (CYMBALTA) 60 MG capsule 60 mg daily.     Marland Kitchen estradiol (ESTRACE) 2 MG tablet Take 2 mg by mouth daily.    . lansoprazole (PREVACID) 30 MG capsule Take 30 mg by mouth daily.    Marland Kitchen lisinopril (PRINIVIL,ZESTRIL) 10 MG tablet 10 mg daily.     . memantine (NAMENDA) 10 MG tablet Take 1 tablet (10 mg total) by mouth 2 (two) times daily. 60 tablet 11  . naproxen (NAPROSYN) 500 MG tablet Take 500 mg by mouth 2 (two) times daily with a meal.    .  trifluoperazine (STELAZINE) 1 MG tablet 1 mg 2 (two) times daily.     . Vitamin D, Ergocalciferol, (DRISDOL) 50000 UNITS CAPS capsule 50,000 Units every 7 (seven) days.      No current facility-administered medications on file prior to visit.    PAST MEDICAL HISTORY: Past Medical History  Diagnosis Date  . Hyperlipemia   . Fatigue   . Anemia   . Hypertension   . Vitamin D deficiency disease   . Back pain   . Dementia     PAST SURGICAL HISTORY: Past Surgical History  Procedure Laterality Date  . Cholecystectomy    . Abdominal hysterectomy       FAMILY HISTORY: Family History  Problem Relation Age of Onset  . Heart Problems Father   . Pulmonary embolism Father   . Heart Problems Mother     SOCIAL HISTORY:  Social History   Social History  . Marital Status: Married    Spouse Name: Rayna Sexton  . Number of Children: 2  . Years of Education: RN   Occupational History  .      Retired Charity fundraiser   Social History Main Topics  . Smoking status: Never Smoker   . Smokeless tobacco: Never Used  . Alcohol Use: 0.6 oz/week    1 Glasses of wine per week     Comment: daily  . Drug Use: No  . Sexual Activity: Not on file   Other Topics Concern  . Not on file   Social History Narrative   Patient lives at home with her husband Rayna Sexton).   Retired Art therapist.   Right handed.   Caffeine two cups of coffee daily.     PHYSICAL EXAM   Filed Vitals:   03/14/15 1114  BP: 142/72  Pulse: 68  Height:  (1.549 m)  Weight: 143 lb (64.864 kg)    Not recorded      Body mass index is 27.03 kg/(m^2).  PHYSICAL EXAMNIATION:  Gen: NAD, conversant, well nourised, obese, well groomed                     Cardiovascular: Regular rate rhythm, no peripheral edema, warm, nontender. Eyes: Conjunctivae clear without exudates or hemorrhage Neck: Supple, no carotid bruise. Pulmonary: Clear to auscultation bilaterally   NEUROLOGICAL EXAM:  MENTAL STATUS: Speech:    Speech is normal; fluent and spontaneous with normal comprehension.  Cognition: Mini-Mental Status Examination is 19 out of 30, animal naming was 8     She is not oriented to date, year, day, season, Dr., city, county     Recent and remote memory: Missed 3 out of 3 recalls     Normal Attention span and concentration     Normal Language, naming, repeating,spontaneous speech     Fund of knowledge   CRANIAL NERVES: CN II: Visual fields are full to confrontation. Fundoscopic exam is normal with sharp discs and no vascular changes. Pupils are round equal and  briskly reactive to light. CN III, IV, VI: extraocular movement are normal. No ptosis. CN V: Facial sensation is intact to pinprick in all 3 divisions bilaterally. Corneal responses are intact.  CN VII: Face is symmetric with normal eye closure and smile. CN VIII: Hearing is normal to rubbing fingers CN IX, X: Palate elevates symmetrically. Phonation is normal. CN XI: Head turning and shoulder shrug are intact CN XII: Tongue is midline with normal movements and no atrophy.  MOTOR: There is no pronator drift  of out-stretched arms. Muscle bulk and tone are normal. Muscle strength is normal.  REFLEXES: Reflexes are 2+ and symmetric at the biceps, triceps, knees, and ankles. Plantar responses are flexor.  SENSORY: Intact to light touch, pinprick, position sense, and vibration sense are intact in fingers and toes.  COORDINATION: Rapid alternating movements and fine finger movements are intact. There is no dysmetria on finger-to-nose and heel-knee-shin.    GAIT/STANCE: She needs to push up to get up from seated position, wearing a lumbar brace, guarding her lumbar paraspinal muscles, wide-based, cautious gait,   DIAGNOSTIC DATA (LABS, IMAGING, TESTING) - I reviewed patient records, labs, notes, testing and imaging myself where available.  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 Ph.D.  [facility] [address] [place], [place] [address] [phone]

## 2015-06-14 ENCOUNTER — Encounter: Payer: Self-pay | Admitting: Nurse Practitioner

## 2015-06-14 ENCOUNTER — Ambulatory Visit (INDEPENDENT_AMBULATORY_CARE_PROVIDER_SITE_OTHER): Payer: Medicare Other | Admitting: Nurse Practitioner

## 2015-06-14 VITALS — BP 135/79 | HR 71 | Ht 61.0 in | Wt 143.6 lb

## 2015-06-14 DIAGNOSIS — R29898 Other symptoms and signs involving the musculoskeletal system: Secondary | ICD-10-CM | POA: Diagnosis not present

## 2015-06-14 DIAGNOSIS — F0390 Unspecified dementia without behavioral disturbance: Secondary | ICD-10-CM | POA: Diagnosis not present

## 2015-06-14 DIAGNOSIS — R269 Unspecified abnormalities of gait and mobility: Secondary | ICD-10-CM

## 2015-06-14 NOTE — Progress Notes (Signed)
[facility]  PATIENT: Rachel Holloway DOB: Mar 11, 1935   REASON FOR VISIT: Follow-up for dementia, gait abnormality HISTORY FROM: Husband    HISTORY OF PRESENT ILLNESS: Rachel Holloway 80 yo RH, is accompanied by her husband, referred by her primary care physician Dr. 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 [place] in 2013 to be close to her children, family noticed gradual onset memory trouble since 2014, she tends to repeat herself, getting worse over the past few years, she enjoys walking her dogs, working on crossword puzzles, she has chronic low back pain, mild cautious gait She has quit driving since summer of 2956, using CPAP machine, which has helped her sleep quality, she denied loss sense of smells, no REM sleep disorder, no hallucinations,  There was no family history of dementia  UPDATE Feb 4th 2016:[name] is accompanied by her husband, and daughter at today's clinical visit, family reported that since 2015, she has gradual onset small shuffling gait, bending forward, she denied REM sleep disorder, she had a few years history of decreased sense of smell, she walks her dog every day, has frequent UTI, urinary urgency, Mini-Mental Status Examination is 23 out of 30 We have reviewed MRI of brain, mild to moderate generalized atrophy, mild small vessel disease, I have reviewed laboratory evaluation from her primary care's office, February 03 2014, elevated LDL 127, normal vitamin D 32, CMP, creatinine 1.0, CBC showed mildly low WBC 3.8, elevated MCV 104, UPDATE August 10th 2016:YY Her memory continues to get worse. Today's Mini-Mental Status Examination is 21 out of 30, with animal naming 7. She sleeps well, using CPAP machine, has good appetite, but both she and her husband are confused about her medications, last refill of Namenda was March 2016, she denied REM sleep disorder, no loss of smell, mild small shuffling  unsteady gait UPDATE Mar 14 2015:YY She is accompanied by her husband at today's clinical visit, reported she has worsening memory trouble, she can tolerate Namenda 10 mg twice a day well, use CPAP machine sleep well at daytime, good appetite, chronic low back pain, wear lumbar brace, mild gait difficulty, use walker, she denies bowel and bladder incontinence. There is no significant behavior issues.  UPDATE 05/16/2017CM Ms. Rachel Holloway, 80 year old female returns for follow-up she has a history of dementia and is currently on Namenda and placed on Aricept at her last visit. Husband brings in current medication list today and she is also on Exelon patch from primary care. She has long history of back pain wears a lumbar brace and has gait difficulty. There is no bowel or bladder incontinence. She also has a history of obstructive sleep apnea and uses CPAP at night. Appetite is fairly good she sleeps well no behavior issues no wandering behavior. No hallucinations. She returns for reevaluation   REVIEW OF SYSTEMS: Full 14 system review of systems performed and notable only for those listed, all others are neg:  Constitutional: neg  Cardiovascular: neg Ear/Nose/Throat: neg  Skin: neg Eyes: neg Respiratory: neg Gastroitestinal: neg  Hematology/Lymphatic: neg  Endocrine: neg Musculoskeletal:neg Allergy/Immunology: neg Neurological: neg Psychiatric: neg Sleep : neg   ALLERGIES: Allergies  Allergen Reactions  . Azithromycin   . Codeine Rash  . Erythromycin Nausea And Vomiting  . Penicillins Rash    HOME MEDICATIONS: Outpatient Prescriptions Prior to Visit  Medication Sig Dispense Refill  . aspirin 81 MG tablet Take 81 mg by mouth daily.    Marland Kitchen  atorvastatin (LIPITOR) 80 MG tablet 80 mg daily.     Marland Kitchen donepezil (ARICEPT) 10 MG tablet Take 1 tablet (10 mg total) by mouth at bedtime. 90 tablet 3  . DULoxetine (CYMBALTA) 60 MG capsule 60 mg daily.     Marland Kitchen estradiol (ESTRACE) 2 MG tablet Take 2 mg by  mouth daily.    . lansoprazole (PREVACID) 30 MG capsule Take 30 mg by mouth daily.    Marland Kitchen lisinopril (PRINIVIL,ZESTRIL) 10 MG tablet 10 mg daily.     . memantine (NAMENDA) 10 MG tablet Take 1 tablet (10 mg total) by mouth 2 (two) times daily. 180 tablet 3  . naproxen (NAPROSYN) 500 MG tablet Take 500 mg by mouth 2 (two) times daily with a meal.    . trifluoperazine (STELAZINE) 1 MG tablet 1 mg 2 (two) times daily.     . Vitamin D, Ergocalciferol, (DRISDOL) 50000 UNITS CAPS capsule 50,000 Units every 7 (seven) days.      No facility-administered medications prior to visit.    PAST MEDICAL HISTORY: Past Medical History  Diagnosis Date  . Hyperlipemia   . Fatigue   . Anemia   . Hypertension   . Vitamin D deficiency disease   . Back pain   . Dementia     PAST SURGICAL HISTORY: Past Surgical History  Procedure Laterality Date  . Cholecystectomy    . Abdominal hysterectomy      FAMILY HISTORY: Family History  Problem Relation Age of Onset  . Heart Problems Father   . Pulmonary embolism Father   . Heart Problems Mother     SOCIAL HISTORY: Social History   Social History  . Marital Status: Married    Spouse Name: Rayna Sexton  . Number of Children: 2  . Years of Education: RN   Occupational History  .      Retired Charity fundraiser   Social History Main Topics  . Smoking status: Never Smoker   . Smokeless tobacco: Never Used  . Alcohol Use: 0.6 oz/week    1 Glasses of wine per week     Comment: daily  . Drug Use: No  . Sexual Activity: Not on file   Other Topics Concern  . Not on file   Social History Narrative   Patient lives at home with her husband Rayna Sexton).   Retired Art therapist.   Right handed.   Caffeine two cups of coffee daily.     PHYSICAL EXAM  Filed Vitals:   06/14/15 1342  BP: 135/79  Pulse: 71  Height:  (1.549 m)  Weight: 143 lb 9.6 oz (65.137 kg)   Body mass index is 27.15 kg/(m^2).  Generalized: Well developed, Obese female well groomed  in no acute distress  Head: normocephalic and atraumatic,. Oropharynx benign  Neck: Supple, no carotid bruits  Cardiac: Regular rate rhythm, no murmur  Musculoskeletal: No deformity   Neurological examination   Mentation: Alert oriented to time, place, MMSE 18/30. AFT 3. Clock drawing 2/4  Follows all commands speech and language fluent.   Cranial nerve II-XII: Fundoscopic exam reveals sharp disc margins.Pupils were equal round reactive to light extraocular movements were full, visual field were full on confrontational test. Facial sensation and strength were normal. hearing was intact to finger rubbing bilaterally. Uvula tongue midline. head turning and shoulder shrug were normal and symmetric.Tongue protrusion into cheek strength was normal. Motor: normal bulk and tone, full strength in the BUE, 4/5 BLE, , no pronator drift.  Sensory: normal  and symmetric to light touch, pinprick, and  Vibration, Coordination: finger-nose-finger,  no dysmetria Reflexes: Symmetric upper and lower plantar responses were flexor bilaterally. Gait and Station: Needs to push off from wheelchair, cautious gait DIAGNOSTIC DATA (LABS, IMAGING, TESTING) -     ASSESSMENT AND PLAN  80 y.o. year old female  has a past medical history of Hyperlipemia; Fatigue; Anemia; Hypertension; Vitamin D deficiency disease; Back pain; and Dementia. here to follow-up   PLAN:Continue Namenda 10 mg twice a day Continue  Aricept 10 mg daily Outpatient rehabilitation evaluate and treat lower extremity weakness, Stop Exelon patch Follow-up in 6 months Nilda RiggsNancy Carolyn Ciara Kagan, [facility] IncGNP, [facility] CenterBC, APRN  [facility] [address], [address] [place], [place] [address] [phone]

## 2015-06-14 NOTE — Patient Instructions (Addendum)
Continue Namenda 10 mg twice a day Continue  Aricept 10 mg daily Outpatient rehabilitation evaluate and treat Stop exelon patch Follow-up in 6 months

## 2015-06-15 ENCOUNTER — Telehealth: Payer: Self-pay | Admitting: *Deleted

## 2015-06-15 NOTE — Progress Notes (Signed)
I have reviewed and agreed above plan. 

## 2015-06-15 NOTE — Telephone Encounter (Signed)
Fax confirmation of yesterday's ofv note received.  Relayed pt on namenda and aricept.  STOP exelon patch.  Followed up with phone call as well.  LMVM with clinical staff.    [phone], [phone] and  [phone].

## 2015-06-15 NOTE — Telephone Encounter (Signed)
Call pcp with medication management,  Stop exelon patch.

## 2015-06-30 ENCOUNTER — Ambulatory Visit: Payer: Medicare Other | Attending: Nurse Practitioner | Admitting: Physical Therapy

## 2015-06-30 DIAGNOSIS — M6281 Muscle weakness (generalized): Secondary | ICD-10-CM | POA: Diagnosis present

## 2015-06-30 DIAGNOSIS — R2681 Unsteadiness on feet: Secondary | ICD-10-CM | POA: Diagnosis present

## 2015-06-30 DIAGNOSIS — R2689 Other abnormalities of gait and mobility: Secondary | ICD-10-CM | POA: Diagnosis present

## 2015-06-30 NOTE — Therapy (Signed)
[facility] [address]  [address] [place], [place], [address] Phone: [phone]   Fax:  [phone]  Physical Therapy Evaluation  Patient Details  Name: Rachel Holloway MRN: [id number] Date of Birth: 11/11/1935 Referring Provider: Nilda Riggs, NP  Encounter Date: 06/30/2015      PT End of Session - 06/30/15 1238    Visit Number 1   Number of Visits 16   Date for PT Re-Evaluation 08/25/15   PT Start Time 1030   PT Stop Time 1104   PT Time Calculation (min) 34 min   Equipment Utilized During Treatment Gait belt   Activity Tolerance Patient tolerated treatment well   Behavior During Therapy [facility] for tasks assessed/performed      Past Medical History  Diagnosis Date  . Hyperlipemia   . Fatigue   . Anemia   . Hypertension   . Vitamin D deficiency disease   . Back pain   . Dementia     Past Surgical History  Procedure Laterality Date  . Cholecystectomy    . Abdominal hysterectomy      There were no vitals filed for this visit.       Subjective Assessment - 06/30/15 1033    Subjective Pt is a 80 y/o female who presents to OPPT for changes in mobility and balance deficits.  Pt reports she "falls easily."   Patient is accompained by: Family member   Pertinent History HLD, back pain, dementia   Patient Stated Goals walk better   Currently in Pain? No/denies            [facility] PT Assessment - 06/30/15 1034    Assessment   Medical Diagnosis gait abnormality; lower ext weakness   Referring Provider Nilda Riggs, NP   Onset Date/Surgical Date --  6 months   Next MD Visit August 2016   Prior Therapy none   Precautions   Precautions Fall   Restrictions   Weight Bearing Restrictions No   Balance Screen   Has the patient fallen in the past 6 months No   Has the patient had a decrease in activity level because of a fear of falling?  Yes   Is the patient reluctant to leave their home because of a  fear of falling?  Yes   Home Environment   Living Environment Private residence   Living Arrangements Spouse/significant other   Available Help at Discharge Family;Available 24 hours/day   Type of Home House   Home Access Stairs to enter   Entrance Stairs-Number of Steps 3   Entrance Stairs-Rails Right   Home Layout One level   Home Equipment Walker - 4 wheels;Walker - 2 wheels;Wheelchair - manual   Prior Function   Level of Independence Requires assistive device for independence   Vocation Retired   Leisure sudoku, puzzles   Cognition   Overall Cognitive Status History of cognitive impairments - at baseline   Observation/Other Assessments   Focus on Therapeutic Outcomes (FOTO)  27 (73% limited; predicted 50% limited)   Posture/Postural Control   Posture/Postural Control Postural limitations   Postural Limitations Rounded Shoulders;Forward head;Increased thoracic kyphosis;Flexed trunk   Strength   Strength Assessment Site Hip;Knee;Ankle   Right Hip Flexion 4/5   Left Hip Flexion 4/5   Right/Left Knee Right;Left   Right Knee Flexion 4/5   Right Knee Extension 5/5   Left Knee Flexion 4/5   Left Knee Extension 5/5   Right Ankle Dorsiflexion 4/5  Left Ankle Dorsiflexion 4/5   Flexibility   Soft Tissue Assessment /Muscle Length yes  hip flexor tightness noted bil   Hamstrings tightness bil   Ambulation/Gait   Ambulation/Gait Yes   Ambulation/Gait Assistance 4: Min guard   Ambulation Distance (Feet) 75 Feet   Assistive device Rollator   Gait Pattern Shuffle   Ambulation Surface Level;Indoor   Gait velocity 1.42 ft/sec  23.03 sec   Standardized Balance Assessment   Standardized Balance Assessment Berg Balance Test   Berg Balance Test   Sit to Stand Able to stand without using hands and stabilize independently   Standing Unsupported Able to stand safely 2 minutes   Sitting with Back Unsupported but Feet Supported on Floor or Stool Able to sit safely and securely 2 minutes    Stand to Sit Sits safely with minimal use of hands   Transfers Able to transfer safely, definite need of hands   Standing Unsupported with Eyes Closed Able to stand 10 seconds safely   Standing Ubsupported with Feet Together Able to place feet together independently and stand for 1 minute with supervision   From Standing, Reach Forward with Outstretched Arm Can reach forward >12 cm safely (5")   From Standing Position, Pick up Object from Floor Able to pick up shoe safely and easily   From Standing Position, Turn to Look Behind Over each Shoulder Needs supervision when turning   Turn 360 Degrees Needs close supervision or verbal cueing   Standing Unsupported, Alternately Place Feet on Step/Stool Able to complete >2 steps/needs minimal assist   Standing Unsupported, One Foot in Front Able to plae foot ahead of the other independently and hold 30 seconds   Standing on One Leg Tries to lift leg/unable to hold 3 seconds but remains standing independently   Total Score 40                           PT Education - 06/30/15 1237    Education provided Yes   Education Details clinical findings, POC, goals of care   Person(s) Educated Patient;Spouse   Methods Explanation   Comprehension Verbalized understanding          PT Short Term Goals - 06/30/15 1243    PT SHORT TERM GOAL #1   Title pt and caregiver to report compliance with HEP (07/28/15)   Time 4   Period Weeks   Status New   PT SHORT TERM GOAL #2   Title improve BERG balance score to >/= 45/56 for decreased fall risk (07/28/15)   Time 4   Period Weeks   Status New   PT SHORT TERM GOAL #3   Title improve gait velocity to > 1.8 ft/sec for improved mobility and decreased fall risk. (07/28/15)   Time 4   Period Weeks   Status New           PT Long Term Goals - 06/30/15 1252    PT LONG TERM GOAL #1   Title verbalize understanding of fall prevention strategies to decrease risk of reinjury (08/25/15)   Time  8   Period Weeks   Status New   PT LONG TERM GOAL #2   Title improve BERG balance score to >/ 48/56 for improved balance (08/25/15)   Time 8   Period Weeks   Status New   PT LONG TERM GOAL #3   Title improve gait velocity to > 2.0 ft/sec for improved function (08/25/15)  Time 8   Period Weeks   Status New   PT LONG TERM GOAL #4   Title amb > 250' with LRAD with supervision for improved function and mobility (08/25/15)               Plan - 06/30/15 1240    Clinical Impression Statement Pt is a 80 y/o female who presents to OPPT for a moderate complexity evaluation of gait abnormalities and balance deficits.  Pt demonstrates shuffling gait, decreased strength and balance affecting safe functional mobility.  Pt will benefit from PT to maximize function.  Educated pt and caregiver on long term goals of continued exercise and how dementia can affect progress due to cognition and limited carryover.  Pt's husband verbalized understanding.   Rehab Potential Good   PT Frequency 2x / week   PT Duration 8 weeks   PT Treatment/Interventions ADLs/Self Care Home Management;Patient/family education;Neuromuscular re-education;Balance training;Therapeutic exercise;Therapeutic activities;Functional mobility training;Stair training;Gait training;DME Instruction;Vestibular   PT Next Visit Plan issue balance, strengthening HEP, ?OTAGO   Consulted and Agree with Plan of Care Patient;Family member/caregiver   Family Member Consulted husband      Patient will benefit from skilled therapeutic intervention in order to improve the following deficits and impairments:  Abnormal gait, Decreased balance, Decreased strength, Postural dysfunction, Difficulty walking, Decreased cognition  Visit Diagnosis: Other abnormalities of gait and mobility - Plan: PT plan of care cert/re-cert  Muscle weakness (generalized) - Plan: PT plan of care cert/re-cert  Unsteadiness on feet - Plan: PT plan of care  cert/re-cert      G-Codes - 06/30/15 1254    Functional Assessment Tool Used BERG 40/56   Functional Limitation Mobility: Walking and moving around   Mobility: Walking and Moving Around Current Status (A5409(G8978) At least 20 percent but less than 40 percent impaired, limited or restricted   Mobility: Walking and Moving Around Goal Status (W1191(G8979) At least 1 percent but less than 20 percent impaired, limited or restricted       Problem List Patient Active Problem List   Diagnosis Date Noted  . Lumbago 03/04/2014  . Gait difficulty 03/04/2014  . Dementia 02/11/2014   Clarita CraneStephanie F Alyana Kreiter, PT, DPT 06/30/2015 12:56 PM  [facility] [address]  [address] [place], [place], [address] Phone: [phone]   Fax:  [phone]  Name: Mikeal HawthorneLois Barrilleaux MRN: [id number] Date of Birth: 1935/10/16

## 2015-07-04 ENCOUNTER — Ambulatory Visit: Payer: Medicare Other | Admitting: Physical Therapy

## 2015-07-04 DIAGNOSIS — R2689 Other abnormalities of gait and mobility: Secondary | ICD-10-CM | POA: Diagnosis not present

## 2015-07-04 DIAGNOSIS — R2681 Unsteadiness on feet: Secondary | ICD-10-CM

## 2015-07-04 DIAGNOSIS — M6281 Muscle weakness (generalized): Secondary | ICD-10-CM

## 2015-07-04 NOTE — Therapy (Signed)
[facility] [address]  [address] [place], [place], [address] Phone: [phone]   Fax:  [phone]  Physical Therapy Treatment  Patient Details  Name: Rachel Holloway MRN: [id number] Date of Birth: 1935-09-13 Referring Provider: Nilda Riggs, NP  Encounter Date: 07/04/2015      PT End of Session - 07/04/15 1156    Visit Number 2   Number of Visits 16   Date for PT Re-Evaluation 08/25/15   PT Start Time 1115   PT Stop Time 1155   PT Time Calculation (min) 40 min   Activity Tolerance Patient tolerated treatment well   Behavior During Therapy [facility] for tasks assessed/performed      Past Medical History  Diagnosis Date  . Hyperlipemia   . Fatigue   . Anemia   . Hypertension   . Vitamin D deficiency disease   . Back pain   . Dementia     Past Surgical History  Procedure Laterality Date  . Cholecystectomy    . Abdominal hysterectomy      There were no vitals filed for this visit.      Subjective Assessment - 07/04/15 1117    Subjective no falls, no complaints   Patient Stated Goals walk better   Currently in Pain? No/denies                         [facility] Adult PT Treatment/Exercise - 07/04/15 1153    Exercises   Exercises Knee/Hip   Knee/Hip Exercises: Aerobic   Nustep L4 x 5 min             Balance Exercises - 07/04/15 1125    OTAGO PROGRAM   Head Movements Sitting;5 reps   Neck Movements Sitting;5 reps   Back Extension Standing;5 reps   Trunk Movements Standing;5 reps   Ankle Movements Sitting;10 reps   Knee Extensor 10 reps;Weight (comment)  2#   Knee Flexor 10 reps;Weight (comment)  2#   Hip ABductor 10 reps;Weight (comment)  3#   Ankle Plantorflexors 20 reps, support   Ankle Dorsiflexors 20 reps, support  one foot at a time   Knee Bends 10 reps, support   Backwards Walking Support   Sideways Walking Assistive device   Tandem Stance 10 seconds, support   Tandem  Walk Support   One Leg Stand 10 seconds, support   Sit to Stand 10 reps, bilateral support  one support           PT Education - 07/04/15 1156    Education provided Yes   Education Details OTAGO   Person(s) Educated Patient;Spouse   Methods Explanation;Demonstration;Handout   Comprehension Verbalized understanding;Returned demonstration;Need further instruction          PT Short Term Goals - 06/30/15 1243    PT SHORT TERM GOAL #1   Title pt and caregiver to report compliance with HEP (07/28/15)   Time 4   Period Weeks   Status New   PT SHORT TERM GOAL #2   Title improve BERG balance score to >/= 45/56 for decreased fall risk (07/28/15)   Time 4   Period Weeks   Status New   PT SHORT TERM GOAL #3   Title improve gait velocity to > 1.8 ft/sec for improved mobility and decreased fall risk. (07/28/15)   Time 4   Period Weeks   Status New           PT  Long Term Goals - 06/30/15 1252    PT LONG TERM GOAL #1   Title verbalize understanding of fall prevention strategies to decrease risk of reinjury (08/25/15)   Time 8   Period Weeks   Status New   PT LONG TERM GOAL #2   Title improve BERG balance score to >/ 48/56 for improved balance (08/25/15)   Time 8   Period Weeks   Status New   PT LONG TERM GOAL #3   Title improve gait velocity to > 2.0 ft/sec for improved function (08/25/15)   Time 8   Period Weeks   Status New   PT LONG TERM GOAL #4   Title amb > 250' with LRAD with supervision for improved function and mobility (08/25/15)               Plan - 07/04/15 1157    Clinical Impression Statement Issued OTAGO HEP for pt and educated husband on frequency.  Pt tolerated exercises well today with mod cues for technique.   PT Next Visit Plan balance, strengthening   Consulted and Agree with Plan of Care Patient;Family member/caregiver   Family Member Consulted husband      Patient will benefit from skilled therapeutic intervention in order to improve the  following deficits and impairments:     Visit Diagnosis: Other abnormalities of gait and mobility  Muscle weakness (generalized)  Unsteadiness on feet     Problem List Patient Active Problem List   Diagnosis Date Noted  . Lumbago 03/04/2014  . Gait difficulty 03/04/2014  . Dementia 02/11/2014   Clarita CraneStephanie F Verdun Rackley, PT, DPT 07/04/2015 11:58 AM  [facility] [address]  [address] [place], [place], [address] Phone: [phone]   Fax:  [phone]  Name: Rachel Holloway MRN: [id number] Date of Birth: 04-27-1935

## 2015-07-07 ENCOUNTER — Ambulatory Visit: Payer: Medicare Other | Admitting: Physical Therapy

## 2015-07-07 DIAGNOSIS — R2681 Unsteadiness on feet: Secondary | ICD-10-CM

## 2015-07-07 DIAGNOSIS — R2689 Other abnormalities of gait and mobility: Secondary | ICD-10-CM

## 2015-07-07 DIAGNOSIS — M6281 Muscle weakness (generalized): Secondary | ICD-10-CM

## 2015-07-07 NOTE — Therapy (Signed)
[facility] [address]  [address] [place], [place], [address] Phone: [phone]   Fax:  [phone]  Physical Therapy Treatment  Patient Details  Name: Xara Paulding MRN: [id number] Date of Birth: 06-11-1935 Referring Provider: Nilda Riggs, NP  Encounter Date: 07/07/2015      PT End of Session - 07/07/15 1252    Visit Number 3   Number of Visits 16   Date for PT Re-Evaluation 08/25/15   PT Start Time 0945   PT Stop Time 1028   PT Time Calculation (min) 43 min   Equipment Utilized During Treatment Gait belt   Activity Tolerance Patient tolerated treatment well   Behavior During Therapy [facility] for tasks assessed/performed      Past Medical History  Diagnosis Date  . Hyperlipemia   . Fatigue   . Anemia   . Hypertension   . Vitamin D deficiency disease   . Back pain   . Dementia     Past Surgical History  Procedure Laterality Date  . Cholecystectomy    . Abdominal hysterectomy      There were no vitals filed for this visit.      Subjective Assessment - 07/07/15 0952    Subjective no pain or complaints; did exercises "pretty much"   Patient is accompained by: Family member   Pertinent History HLD, back pain, dementia   Patient Stated Goals walk better   Currently in Pain? No/denies                         [facility] Adult PT Treatment/Exercise - 07/07/15 0952    Ambulation/Gait   Ambulation/Gait Yes   Ambulation/Gait Assistance 5: Supervision   Ambulation Distance (Feet) 200 Feet   Assistive device Rollator   Gait Pattern Shuffle   Gait Comments mod cues needed for increased step length; pt easily distracted and unable to redirect and remember PT's cues for increased step length   Knee/Hip Exercises: Aerobic   Nustep L 4 x 8 min   Knee/Hip Exercises: Standing   Heel Raises Both;10 reps   Hip Abduction Both;10 reps;Knee bent   Abduction Limitations pt unable to maintain knee extension    Knee/Hip Exercises: Seated   Long Arc Quad Both;10 reps;Weights   Long Arc Quad Weight 2 lbs.   Marching Limitations x 10 bil   Marching Weights 2 lbs.   Knee/Hip Exercises: Supine   Bridges Limitations x10   Straight Leg Raises Both;10 reps   Straight Leg Raises Limitations 2#   Other Supine Knee/Hip Exercises hip abdct x 10 with blue theraband; hooklying marching x 10 with blue theraband                PT Education - 07/07/15 1027    Education provided Yes   Education Details provided husband with adult care programs in [place]) Educated Spouse   Methods Explanation;Handout   Comprehension Verbalized understanding          PT Short Term Goals - 06/30/15 1243    PT SHORT TERM GOAL #1   Title pt and caregiver to report compliance with HEP (07/28/15)   Time 4   Period Weeks   Status New   PT SHORT TERM GOAL #2   Title improve BERG balance score to >/= 45/56 for decreased fall risk (07/28/15)   Time 4   Period Weeks   Status New   PT SHORT  TERM GOAL #3   Title improve gait velocity to > 1.8 ft/sec for improved mobility and decreased fall risk. (07/28/15)   Time 4   Period Weeks   Status New           PT Long Term Goals - 06/30/15 1252    PT LONG TERM GOAL #1   Title verbalize understanding of fall prevention strategies to decrease risk of reinjury (08/25/15)   Time 8   Period Weeks   Status New   PT LONG TERM GOAL #2   Title improve BERG balance score to >/ 48/56 for improved balance (08/25/15)   Time 8   Period Weeks   Status New   PT LONG TERM GOAL #3   Title improve gait velocity to > 2.0 ft/sec for improved function (08/25/15)   Time 8   Period Weeks   Status New   PT LONG TERM GOAL #4   Title amb > 250' with LRAD with supervision for improved function and mobility (08/25/15)               Plan - 07/07/15 1255    Clinical Impression Statement Pt continues to need mod cues for increased step length with limited  carryover.  Provided husband with adult day programs for pt.  Will continue to benefit from PT to maximize function.   PT Next Visit Plan balance, strengthening   Consulted and Agree with Plan of Care Patient;Family member/caregiver   Family Member Consulted husband      Patient will benefit from skilled therapeutic intervention in order to improve the following deficits and impairments:     Visit Diagnosis: Other abnormalities of gait and mobility  Muscle weakness (generalized)  Unsteadiness on feet     Problem List Patient Active Problem List   Diagnosis Date Noted  . Lumbago 03/04/2014  . Gait difficulty 03/04/2014  . Dementia 02/11/2014   Clarita CraneStephanie F Rekia Kujala, PT, DPT 07/07/2015 12:57 PM  [facility] [address]  [address] [place], [place], [address] Phone: [phone]   Fax:  [phone]  Name: Mikeal HawthorneLois Sypher MRN: [id number] Date of Birth: 1935-11-16

## 2015-07-11 ENCOUNTER — Ambulatory Visit: Payer: Medicare Other | Admitting: Physical Therapy

## 2015-07-11 DIAGNOSIS — R2689 Other abnormalities of gait and mobility: Secondary | ICD-10-CM

## 2015-07-11 DIAGNOSIS — M6281 Muscle weakness (generalized): Secondary | ICD-10-CM

## 2015-07-11 DIAGNOSIS — R2681 Unsteadiness on feet: Secondary | ICD-10-CM

## 2015-07-11 NOTE — Therapy (Signed)
[facility] [address]  [address] [place], [place], [address] Phone: [phone]   Fax:  [phone]  Physical Therapy Treatment  Patient Details  Name: Rachel Holloway MRN: [id number] Date of Birth: Jun 08, 1935 Referring Provider: Nilda Riggs, NP  Encounter Date: 07/11/2015      PT End of Session - 07/11/15 1150    Visit Number 4   Number of Visits 16   Date for PT Re-Evaluation 08/25/15   PT Start Time 1117   PT Stop Time 1159   PT Time Calculation (min) 42 min   Equipment Utilized During Treatment Gait belt   Activity Tolerance Patient tolerated treatment well   Behavior During Therapy [facility] for tasks assessed/performed      Past Medical History  Diagnosis Date  . Hyperlipemia   . Fatigue   . Anemia   . Hypertension   . Vitamin D deficiency disease   . Back pain   . Dementia     Past Surgical History  Procedure Laterality Date  . Cholecystectomy    . Abdominal hysterectomy      There were no vitals filed for this visit.      Subjective Assessment - 07/11/15 1126    Subjective no complaints   Patient Stated Goals walk better   Currently in Pain? No/denies                         [facility] Adult PT Treatment/Exercise - 07/11/15 1127    Ambulation/Gait   Ambulation/Gait Yes   Ambulation/Gait Assistance 5: Supervision   Ambulation Distance (Feet) 250 Feet   Assistive device Rollator   Gait Pattern Shuffle   Gait Comments mod cues needed for increased step length; pt easily distracted and unable to redirect and remember PT's cues for increased step length   Knee/Hip Exercises: Aerobic   Nustep L4 x 10 min   Knee/Hip Exercises: Seated   Long Arc Quad Both;15 reps;Weights   Long Arc Quad Weight 3 lbs.   Clamshell with TheraBand Green  x15   Marching Limitations x 15 bil   Marching Weights 3 lbs.   Hamstring Curl Both;15 reps   Hamstring Limitations green theraband   Knee/Hip  Exercises: Supine   Bridges Limitations x15   Other Supine Knee/Hip Exercises hooklying marching x 15 bil with 3#             Balance Exercises - 07/11/15 1148    Balance Exercises: Standing   Standing Eyes Opened Wide (BOA);Foam/compliant surface;Head turns  horizontal/vertical head turns x 10 each   Standing Eyes Closed Wide (BOA);Foam/compliant surface;3 reps;10 secs   Other Standing Exercises mod cues needed for technique and attention to task             PT Short Term Goals - 06/30/15 1243    PT SHORT TERM GOAL #1   Title pt and caregiver to report compliance with HEP (07/28/15)   Time 4   Period Weeks   Status New   PT SHORT TERM GOAL #2   Title improve BERG balance score to >/= 45/56 for decreased fall risk (07/28/15)   Time 4   Period Weeks   Status New   PT SHORT TERM GOAL #3   Title improve gait velocity to > 1.8 ft/sec for improved mobility and decreased fall risk. (07/28/15)   Time 4   Period Weeks   Status New  PT Long Term Goals - 06/30/15 1252    PT LONG TERM GOAL #1   Title verbalize understanding of fall prevention strategies to decrease risk of reinjury (08/25/15)   Time 8   Period Weeks   Status New   PT LONG TERM GOAL #2   Title improve BERG balance score to >/ 48/56 for improved balance (08/25/15)   Time 8   Period Weeks   Status New   PT LONG TERM GOAL #3   Title improve gait velocity to > 2.0 ft/sec for improved function (08/25/15)   Time 8   Period Weeks   Status New   PT LONG TERM GOAL #4   Title amb > 250' with LRAD with supervision for improved function and mobility (08/25/15)               Plan - 07/11/15 1150    Clinical Impression Statement Pt tolerates exercise well without complaints.  Demonstrated difficulty on compliant surface and would benefit from additional balance interventions.  Cognition continues to limit learning and carryover.     PT Treatment/Interventions ADLs/Self Care Home  Management;Patient/family education;Neuromuscular re-education;Balance training;Therapeutic exercise;Therapeutic activities;Functional mobility training;Stair training;Gait training;DME Instruction;Vestibular   PT Next Visit Plan balance, strengthening, compliant surface activities   Consulted and Agree with Plan of Care Patient      Patient will benefit from skilled therapeutic intervention in order to improve the following deficits and impairments:     Visit Diagnosis: Other abnormalities of gait and mobility  Muscle weakness (generalized)  Unsteadiness on feet     Problem List Patient Active Problem List   Diagnosis Date Noted  . Lumbago 03/04/2014  . Gait difficulty 03/04/2014  . Dementia 02/11/2014   Clarita CraneStephanie F Kinan Safley, PT, DPT 07/11/2015 11:59 AM  [facility] [address]  [address] [place], [place], [address] Phone: [phone]   Fax:  [phone]  Name: Mikeal HawthorneLois Mccuiston MRN: [id number] Date of Birth: 11-Jan-1936

## 2015-07-14 ENCOUNTER — Ambulatory Visit: Payer: Medicare Other | Admitting: Physical Therapy

## 2015-07-14 DIAGNOSIS — R2681 Unsteadiness on feet: Secondary | ICD-10-CM

## 2015-07-14 DIAGNOSIS — R2689 Other abnormalities of gait and mobility: Secondary | ICD-10-CM

## 2015-07-14 DIAGNOSIS — M6281 Muscle weakness (generalized): Secondary | ICD-10-CM

## 2015-07-14 NOTE — Therapy (Signed)
[facility] [address]  [address] [place], [place], [address] Phone: [phone]   Fax:  [phone]  Physical Therapy Treatment  Patient Details  Name: Rachel Holloway MRN: [id number] Date of Birth: 1935/10/17 Referring Provider: Nilda Riggs, NP  Encounter Date: 07/14/2015      PT End of Session - 07/14/15 1027    Visit Number 5   Number of Visits 16   Date for PT Re-Evaluation 08/25/15   PT Start Time 0955  pt arrived late   PT Stop Time 1026   PT Time Calculation (min) 31 min   Equipment Utilized During Treatment Gait belt   Activity Tolerance Patient tolerated treatment well   Behavior During Therapy [facility] for tasks assessed/performed      Past Medical History  Diagnosis Date  . Hyperlipemia   . Fatigue   . Anemia   . Hypertension   . Vitamin D deficiency disease   . Back pain   . Dementia     Past Surgical History  Procedure Laterality Date  . Cholecystectomy    . Abdominal hysterectomy      There were no vitals filed for this visit.      Subjective Assessment - 07/14/15 0957    Subjective doing well; no pain. husband having trouble finding ankle weights   Patient is accompained by: Family member   Pertinent History HLD, back pain, dementia   Patient Stated Goals walk better   Currently in Pain? No/denies                         [facility] Adult PT Treatment/Exercise - 07/14/15 0958    Knee/Hip Exercises: Aerobic   Nustep L 6 x 10 min   Knee/Hip Exercises: Standing   Heel Raises Both;20 reps   Knee Flexion Both;10 reps   Knee Flexion Limitations 3#   Hip Flexion Both;10 reps;Knee bent   Hip Flexion Limitations 3#   Hip Abduction Both;10 reps;Knee bent   Abduction Limitations pt unable to maintain knee extension, 3#             Balance Exercises - 07/14/15 1017    Balance Exercises: Standing   Stepping Strategy Anterior;Lateral;UE support;10 reps   Retro Gait Upper  extremity support;2 reps   Sidestepping Upper extremity support;2 reps             PT Short Term Goals - 06/30/15 1243    PT SHORT TERM GOAL #1   Title pt and caregiver to report compliance with HEP (07/28/15)   Time 4   Period Weeks   Status New   PT SHORT TERM GOAL #2   Title improve BERG balance score to >/= 45/56 for decreased fall risk (07/28/15)   Time 4   Period Weeks   Status New   PT SHORT TERM GOAL #3   Title improve gait velocity to > 1.8 ft/sec for improved mobility and decreased fall risk. (07/28/15)   Time 4   Period Weeks   Status New           PT Long Term Goals - 06/30/15 1252    PT LONG TERM GOAL #1   Title verbalize understanding of fall prevention strategies to decrease risk of reinjury (08/25/15)   Time 8   Period Weeks   Status New   PT LONG TERM GOAL #2   Title improve BERG balance score to >/ 48/56 for improved balance (08/25/15)  Time 8   Period Weeks   Status New   PT LONG TERM GOAL #3   Title improve gait velocity to > 2.0 ft/sec for improved function (08/25/15)   Time 8   Period Weeks   Status New   PT LONG TERM GOAL #4   Title amb > 250' with LRAD with supervision for improved function and mobility (08/25/15)               Plan - 07/14/15 1028    Clinical Impression Statement Pt fatigued with standing activities today needing seated rest breaks.  Will continue to benefit from PT to maximize function.   PT Next Visit Plan balance, strengthening, compliant surface activities   Consulted and Agree with Plan of Care Patient;Family member/caregiver   Family Member Consulted husband      Patient will benefit from skilled therapeutic intervention in order to improve the following deficits and impairments:     Visit Diagnosis: Other abnormalities of gait and mobility  Muscle weakness (generalized)  Unsteadiness on feet     Problem List Patient Active Problem List   Diagnosis Date Noted  . Lumbago 03/04/2014  . Gait  difficulty 03/04/2014  . Dementia 02/11/2014   Clarita CraneStephanie F Sim Choquette, PT, DPT 07/14/2015 10:30 AM  [facility] [address]  [address] [place], [place], [address] Phone: [phone]   Fax:  [phone]  Name: Mikeal HawthorneLois Glade MRN: [id number] Date of Birth: [birth year]

## 2015-07-18 ENCOUNTER — Ambulatory Visit: Payer: Medicare Other | Admitting: Physical Therapy

## 2015-07-18 DIAGNOSIS — R2689 Other abnormalities of gait and mobility: Secondary | ICD-10-CM

## 2015-07-18 DIAGNOSIS — M6281 Muscle weakness (generalized): Secondary | ICD-10-CM

## 2015-07-18 DIAGNOSIS — R2681 Unsteadiness on feet: Secondary | ICD-10-CM

## 2015-07-18 NOTE — Therapy (Signed)
[facility] [address]  [address] [place], [place], [address] Phone: [phone]   Fax:  [phone]  Physical Therapy Treatment  Patient Details  Name: Rachel Holloway MRN: [id number] Date of Birth: Jun 10, 1935 Referring Provider: Nilda Riggs, NP  Encounter Date: 07/18/2015      PT End of Session - 07/18/15 1249    Visit Number 6   Number of Visits 16   Date for PT Re-Evaluation 08/25/15   PT Start Time 1117   PT Stop Time 1155   PT Time Calculation (min) 38 min   Equipment Utilized During Treatment Gait belt   Activity Tolerance Patient tolerated treatment well   Behavior During Therapy [facility] for tasks assessed/performed      Past Medical History  Diagnosis Date  . Hyperlipemia   . Fatigue   . Anemia   . Hypertension   . Vitamin D deficiency disease   . Back pain   . Dementia     Past Surgical History  Procedure Laterality Date  . Cholecystectomy    . Abdominal hysterectomy      There were no vitals filed for this visit.      Subjective Assessment - 07/18/15 1124    Subjective no pain; doing exercises at home.  pt reports husband purchased ankle weights   Patient Stated Goals walk better   Currently in Pain? No/denies                         [facility] Adult PT Treatment/Exercise - 07/18/15 1125    Ambulation/Gait   Ambulation Distance (Feet) 373 Feet  seated rest break at 180'   Gait Comments pt able to maintain increased step length for only 5-13' before reverting back to shuffling pattern   Knee/Hip Exercises: Aerobic   Nustep L 6 x 10 min           PWR [facility]) - 07/18/15 1204    PWR! exercises Moves in sitting   PWR! Up x10   PWR! Rock x10   PWR! Twist x10   PWR! Step x10   Comments in sitting               PT Short Term Goals - 06/30/15 1243    PT SHORT TERM GOAL #1   Title pt and caregiver to report compliance with HEP (07/28/15)   Time 4   Period Weeks    Status New   PT SHORT TERM GOAL #2   Title improve BERG balance score to >/= 45/56 for decreased fall risk (07/28/15)   Time 4   Period Weeks   Status New   PT SHORT TERM GOAL #3   Title improve gait velocity to > 1.8 ft/sec for improved mobility and decreased fall risk. (07/28/15)   Time 4   Period Weeks   Status New           PT Long Term Goals - 06/30/15 1252    PT LONG TERM GOAL #1   Title verbalize understanding of fall prevention strategies to decrease risk of reinjury (08/25/15)   Time 8   Period Weeks   Status New   PT LONG TERM GOAL #2   Title improve BERG balance score to >/ 48/56 for improved balance (08/25/15)   Time 8   Period Weeks   Status New   PT LONG TERM GOAL #3   Title improve gait velocity to > 2.0 ft/sec for  improved function (08/25/15)   Time 8   Period Weeks   Status New   PT LONG TERM GOAL #4   Title amb > 250' with LRAD with supervision for improved function and mobility (08/25/15)               Plan - 07/18/15 1249    Clinical Impression Statement Pt with increased shuffling after ~ 150' needing increased cues.  Limited carryover for increased step length; pt only able to maintain for 5-13' before needing cues.     PT Next Visit Plan balance, strengthening, compliant surface activities   Consulted and Agree with Plan of Care Patient      Patient will benefit from skilled therapeutic intervention in order to improve the following deficits and impairments:     Visit Diagnosis: Other abnormalities of gait and mobility  Muscle weakness (generalized)  Unsteadiness on feet     Problem List Patient Active Problem List   Diagnosis Date Noted  . Lumbago 03/04/2014  . Gait difficulty 03/04/2014  . Dementia 02/11/2014   Clarita CraneStephanie F Mariel Lukins, PT, DPT 07/18/2015 12:51 PM  [facility] [address]  [address] [place], [place], [address] Phone: [phone]   Fax:   [phone]  Name: Rachel Holloway MRN: [id number] Date of Birth: November 24, 1935

## 2015-07-21 ENCOUNTER — Ambulatory Visit: Payer: Medicare Other | Admitting: Physical Therapy

## 2015-07-21 DIAGNOSIS — R2689 Other abnormalities of gait and mobility: Secondary | ICD-10-CM | POA: Diagnosis not present

## 2015-07-21 DIAGNOSIS — M6281 Muscle weakness (generalized): Secondary | ICD-10-CM

## 2015-07-21 DIAGNOSIS — R2681 Unsteadiness on feet: Secondary | ICD-10-CM

## 2015-07-21 NOTE — Therapy (Signed)
[facility] [address]  [address] [place], [place], [address] Phone: [phone]   Fax:  [phone]  Physical Therapy Treatment  Patient Details  Name: Rachel HawthorneLois Holloway MRN: [id number] Date of Birth: 1935-05-25 Referring Provider: Nilda RiggsNancy Carolyn Martin, NP  Encounter Date: 07/21/2015      PT End of Session - 07/21/15 1027    Visit Number 7   PT Start Time 0945   PT Stop Time 1026   PT Time Calculation (min) 41 min   Equipment Utilized During Treatment Gait belt   Activity Tolerance Patient tolerated treatment well   Behavior During Therapy [facility] for tasks assessed/performed      Past Medical History  Diagnosis Date  . Hyperlipemia   . Fatigue   . Anemia   . Hypertension   . Vitamin D deficiency disease   . Back pain   . Dementia     Past Surgical History  Procedure Laterality Date  . Cholecystectomy    . Abdominal hysterectomy      There were no vitals filed for this visit.      Subjective Assessment - 07/21/15 0949    Subjective no complaints   Patient Stated Goals walk better   Currently in Pain? No/denies                         [facility] Treatment/Exercise - 07/21/15 0949    Ambulation/Gait   Ambulation Distance (Feet) 300 Feet  seated rest break at 150'   Assistive device Rollator   Gait Pattern Shuffle   Gait Comments frequent cues for increased step length   Knee/Hip Exercises: Aerobic   Nustep L 6 x 5 min   Knee/Hip Exercises: Seated   Long Arc Quad Both;15 reps;Weights   Long Arc Quad Weight 3 lbs.   Clamshell with TheraBand Green  x15   Marching Limitations x 15 bil   Marching Weights 3 lbs.   Hamstring Curl Both;15 reps   Hamstring Limitations green theraband             Balance Exercises - 07/21/15 1004    OTAGO PROGRAM   Backwards Walking Support   Sideways Walking Assistive device   Tandem Stance 10 seconds, support   Tandem Walk Support              PT Short Term Goals - 06/30/15 1243    PT SHORT TERM GOAL #1   Title pt and caregiver to report compliance with HEP (07/28/15)   Time 4   Period Weeks   Status New   PT SHORT TERM GOAL #2   Title improve BERG balance score to >/= 45/56 for decreased fall risk (07/28/15)   Time 4   Period Weeks   Status New   PT SHORT TERM GOAL #3   Title improve gait velocity to > 1.8 ft/sec for improved mobility and decreased fall risk. (07/28/15)   Time 4   Period Weeks   Status New           PT Long Term Goals - 06/30/15 1252    PT LONG TERM GOAL #1   Title verbalize understanding of fall prevention strategies to decrease risk of reinjury (08/25/15)   Time 8   Period Weeks   Status New   PT LONG TERM GOAL #2   Title improve BERG balance score to >/ 48/56 for improved balance (08/25/15)   Time 8  Period Weeks   Status New   PT LONG TERM GOAL #3   Title improve gait velocity to > 2.0 ft/sec for improved function (08/25/15)   Time 8   Period Weeks   Status New   PT LONG TERM GOAL #4   Title amb > 250' with LRAD with supervision for improved function and mobility (08/25/15)               Plan - 07/21/15 1027    Clinical Impression Statement Pt tolerates exercises well but continues to demonstrate limited carryover.  Spoke with husband and recommended increasing amb in the home as able; pt appears reluctant to progress due to gait abnormalities.  Anticipate d/c next week.   PT Next Visit Plan balance, strengthening, compliant surface activities   Consulted and Agree with Plan of Care Patient   Family Member Consulted husband      Patient will benefit from skilled therapeutic intervention in order to improve the following deficits and impairments:     Visit Diagnosis: Other abnormalities of gait and mobility  Muscle weakness (generalized)  Unsteadiness on feet     Problem List Patient Active Problem List   Diagnosis Date Noted  . Lumbago 03/04/2014  .  Gait difficulty 03/04/2014  . Dementia 02/11/2014   Clarita CraneStephanie F Breann Losano, PT, DPT 07/21/2015 10:31 AM  [facility] [address]  [address] [place], [place], [address] Phone: [phone]   Fax:  [phone]  Name: Rachel HawthorneLois Holloway MRN: [id number] Date of Birth: 12-22-1935

## 2015-07-25 ENCOUNTER — Ambulatory Visit: Payer: Medicare Other | Admitting: Physical Therapy

## 2015-07-25 DIAGNOSIS — R2689 Other abnormalities of gait and mobility: Secondary | ICD-10-CM

## 2015-07-25 DIAGNOSIS — M6281 Muscle weakness (generalized): Secondary | ICD-10-CM

## 2015-07-25 DIAGNOSIS — R2681 Unsteadiness on feet: Secondary | ICD-10-CM

## 2015-07-25 NOTE — Therapy (Signed)
[facility] [place] [address]  [address] [place], [place], [address] Phone: [phone]   Fax:  [phone]  Physical Therapy Treatment  Patient Details  Name: Rachel HawthorneLois Alameda MRN: [id number] Date of Birth: 19-Sep-1935 Referring Provider: Nilda RiggsNancy Carolyn Martin, NP  Encounter Date: 07/25/2015      PT End of Session - 07/25/15 1148    Visit Number 8   PT Start Time 1115   PT Stop Time 1155   PT Time Calculation (min) 40 min   Equipment Utilized During Treatment Gait belt   Activity Tolerance Patient tolerated treatment well   Behavior During Therapy [facility] for tasks assessed/performed      Past Medical History  Diagnosis Date  . Hyperlipemia   . Fatigue   . Anemia   . Hypertension   . Vitamin D deficiency disease   . Back pain   . Dementia     Past Surgical History  Procedure Laterality Date  . Cholecystectomy    . Abdominal hysterectomy      There were no vitals filed for this visit.      Subjective Assessment - 07/25/15 1148    Subjective no complaints; doing well   Patient is accompained by: Family member   Pertinent History HLD, back pain, dementia   Patient Stated Goals walk better   Currently in Pain? No/denies            [facility] PT Assessment - 07/25/15 1122    Ambulation/Gait   Ambulation/Gait Yes   Ambulation/Gait Assistance 4: Min guard;5: Supervision   Ambulation Distance (Feet) 172 Feet   Assistive device Rollator   Gait Pattern Shuffle   Gait velocity 0.71 ft/sec  45.69 sec   Berg Balance Test   Sit to Stand Able to stand without using hands and stabilize independently   Standing Unsupported Able to stand safely 2 minutes   Sitting with Back Unsupported but Feet Supported on Floor or Stool Able to sit safely and securely 2 minutes   Stand to Sit Sits safely with minimal use of hands   Transfers Able to transfer safely, definite need of hands   Standing Unsupported with Eyes Closed Able to stand 10  seconds safely   Standing Ubsupported with Feet Together Needs help to attain position but able to stand for 30 seconds with feet together   From Standing, Reach Forward with Outstretched Arm Can reach forward >12 cm safely (5")   From Standing Position, Pick up Object from Floor Able to pick up shoe safely and easily   From Standing Position, Turn to Look Behind Over each Shoulder Needs supervision when turning   Turn 360 Degrees Needs close supervision or verbal cueing   Standing Unsupported, Alternately Place Feet on Step/Stool Able to complete >2 steps/needs minimal assist   Standing Unsupported, One Foot in Front Able to take small step independently and hold 30 seconds   Standing on One Leg Tries to lift leg/unable to hold 3 seconds but remains standing independently   Total Score 37                     [facility] Adult PT Treatment/Exercise - 07/25/15 1122    Knee/Hip Exercises: Aerobic   Nustep L 6 x 6 min   Knee/Hip Exercises: Supine   Short Arc Quad Sets Both;10 reps   Short Arc Quad Sets Limitations 3#   Bridges Limitations x10   Straight Leg Raises Both;10 reps   Straight  Leg Raises Limitations 3#                  PT Short Term Goals - 06/30/15 1243    PT SHORT TERM GOAL #1   Title pt and caregiver to report compliance with HEP (07/28/15)   Time 4   Period Weeks   Status New   PT SHORT TERM GOAL #2   Title improve BERG balance score to >/= 45/56 for decreased fall risk (07/28/15)   Time 4   Period Weeks   Status New   PT SHORT TERM GOAL #3   Title improve gait velocity to > 1.8 ft/sec for improved mobility and decreased fall risk. (07/28/15)   Time 4   Period Weeks   Status New           PT Long Term Goals - 06/30/15 1252    PT LONG TERM GOAL #1   Title verbalize understanding of fall prevention strategies to decrease risk of reinjury (08/25/15)   Time 8   Period Weeks   Status New   PT LONG TERM GOAL #2   Title improve BERG balance score  to >/ 48/56 for improved balance (08/25/15)   Time 8   Period Weeks   Status New   PT LONG TERM GOAL #3   Title improve gait velocity to > 2.0 ft/sec for improved function (08/25/15)   Time 8   Period Weeks   Status New   PT LONG TERM GOAL #4   Title amb > 250' with LRAD with supervision for improved function and mobility (08/25/15)               Plan - 07/25/15 1149    Clinical Impression Statement Pt score on BERG decreased from 40/56 to 37/56 likely due to decreased carryover and cognition.  Pt very fearful of falling needing max encouragement to perform BERG without UE support (unable to perform some activities withour UE support).  Anticipate d/c next visit due to limited progress and carryover.   PT Next Visit Plan finish checking goals; d/c, g code   Consulted and Agree with Plan of Care Patient;Family member/caregiver   Family Member Consulted husband      Patient will benefit from skilled therapeutic intervention in order to improve the following deficits and impairments:     Visit Diagnosis: Other abnormalities of gait and mobility  Muscle weakness (generalized)  Unsteadiness on feet     Problem List Patient Active Problem List   Diagnosis Date Noted  . Lumbago 03/04/2014  . Gait difficulty 03/04/2014  . Dementia 02/11/2014   Clarita CraneStephanie F Jeffrie Stander, PT, DPT 07/25/2015 12:54 PM  [facility] [address]  [address] [place], [place], [address] Phone: [phone]   Fax:  [phone]  Name: Rachel HawthorneLois Emanuelson MRN: [id number] Date of Birth: [birth year]

## 2015-07-28 ENCOUNTER — Ambulatory Visit: Payer: Medicare Other | Admitting: Physical Therapy

## 2015-07-28 DIAGNOSIS — R2681 Unsteadiness on feet: Secondary | ICD-10-CM

## 2015-07-28 DIAGNOSIS — R2689 Other abnormalities of gait and mobility: Secondary | ICD-10-CM | POA: Diagnosis not present

## 2015-07-28 DIAGNOSIS — M6281 Muscle weakness (generalized): Secondary | ICD-10-CM

## 2015-07-28 NOTE — Therapy (Signed)
[facility] [place] [address]  [place], [place], [address] Phone: [phone]   Fax:  [phone]  Physical Therapy Treatment  Patient Details  Name: Rachel Holloway MRN: [id number] Date of Birth: [birth year] Referring Provider: Dennie Bible, NP  Encounter Date: 07/28/2015      PT End of Session - 07/28/15 1030    Visit Number 9   PT Start Time 0946   PT Stop Time 1015   PT Time Calculation (min) 29 min   Equipment Utilized During Treatment Gait belt   Activity Tolerance Patient tolerated treatment well   Behavior During Therapy [facility] for tasks assessed/performed      Past Medical History  Diagnosis Date  . Hyperlipemia   . Fatigue   . Anemia   . Hypertension   . Vitamin D deficiency disease   . Back pain   . Dementia     Past Surgical History  Procedure Laterality Date  . Cholecystectomy    . Abdominal hysterectomy      There were no vitals filed for this visit.          [facility] PT Assessment - 07/28/15 0955    Observation/Other Assessments   Focus on Therapeutic Outcomes (FOTO)  34 (66% limited) - survey completed by husband   Ambulation/Gait   Ambulation/Gait Yes   Ambulation/Gait Assistance 4: Min guard   Ambulation Distance (Feet) 262 Feet  without rest   Assistive device Rollator   Gait Pattern Shuffle   Gait velocity 1.15 ft/sec  28.44 sec                     [facility] Adult PT Treatment/Exercise - 07/28/15 0955    Self-Care   Self-Care Other Self-Care Comments   Other Self-Care Comments  educated pt/husband on fall prevention strategies including bed alarms and door alarms                PT Education - 07/28/15 1029    Education provided Yes   Education Details fall prevention strategies   Person(s) Educated Patient;Spouse   Methods Explanation;Handout   Comprehension Verbalized understanding          PT Short Term Goals - 07/28/15 1032    PT SHORT TERM  GOAL #1   Title pt and caregiver to report compliance with HEP (07/28/15)   Status Achieved   PT SHORT TERM GOAL #2   Title improve BERG balance score to >/= 45/56 for decreased fall risk (07/28/15)   Status Not Met   PT SHORT TERM GOAL #3   Title improve gait velocity to > 1.8 ft/sec for improved mobility and decreased fall risk. (07/28/15)   Status Not Met           PT Long Term Goals - 07/28/15 1032    PT LONG TERM GOAL #1   Title verbalize understanding of fall prevention strategies to decrease risk of reinjury (08/25/15)   Status Achieved   PT LONG TERM GOAL #2   Title improve BERG balance score to >/ 48/56 for improved balance (08/25/15)   Status Not Met   PT LONG TERM GOAL #3   Title improve gait velocity to > 2.0 ft/sec for improved function (08/25/15)   Status Not Met   PT LONG TERM GOAL #4   Title amb > 250' with LRAD with supervision for improved function and mobility (08/25/15)   Baseline amb 250' with minguard A and max cues for  increased step length   Status Partially Met               Plan - July 30, 2015 1030    Clinical Impression Statement Pt seen for 9 visits and 4 weeks; at this time pt has not made any significant improvements in balance, gait or functional mobility.  This is likely due to cognition and progressive dementia.  Recommended pt continue HEP 3-4 times/wk to maintain current level of function.  Husband verbalized understanding.  Will d/c PT today due to limited progress.   PT Next Visit Plan d/c PT today   Consulted and Agree with Plan of Care Patient;Family member/caregiver   Family Member Consulted husband      Patient will benefit from skilled therapeutic intervention in order to improve the following deficits and impairments:  Abnormal gait, Decreased balance, Decreased strength, Postural dysfunction, Difficulty walking, Decreased cognition  Visit Diagnosis: Other abnormalities of gait and mobility  Unsteadiness on feet  Muscle weakness  (generalized)       G-Codes - Jul 30, 2015 1033    Functional Assessment Tool Used BERG 37/56   Functional Limitation Mobility: Walking and moving around   Mobility: Walking and Moving Around Goal Status [phone]) At least 1 percent but less than 20 percent impaired, limited or restricted   Mobility: Walking and Moving Around Discharge Status [phone]) At least 20 percent but less than 40 percent impaired, limited or restricted      Problem List Patient Active Problem List   Diagnosis Date Noted  . Lumbago 03/04/2014  . Gait difficulty 03/04/2014  . Dementia 02/11/2014   Laureen Abrahams, PT, DPT 07/30/2015 10:34 AM  [facility] [address]  [place], [place], [address] Phone: [phone]   Fax:  [phone]  Name: Rachel Holloway MRN: [id number] Date of Birth: 1935/11/09       PHYSICAL THERAPY DISCHARGE SUMMARY  Visits from Start of Care: 9  Current functional level related to goals / functional outcomes: See above   Remaining deficits: Pt continues to be fall risk and demonstrates decreased balance and minguard A for amb due to dementia and limited carryover   Education / Equipment: HEP - OTAGO, fall prevention, adult day programs  Plan: Patient agrees to discharge.  Patient goals were partially met. Patient is being discharged due to lack of progress.  ?????   Laureen Abrahams, PT, DPT 2015/07/30 10:36 AM  [facility] [place]. [place], [place] [address]  [phone] (office) [phone] (fax)

## 2015-07-28 NOTE — Patient Instructions (Signed)

## 2015-09-12 ENCOUNTER — Ambulatory Visit: Payer: Medicare Other | Admitting: Nurse Practitioner

## 2015-11-03 ENCOUNTER — Telehealth: Payer: Self-pay | Admitting: Nurse Practitioner

## 2015-11-03 NOTE — Telephone Encounter (Signed)
Patient reports new of dizziness that started 2 wks ago.  An appointment was made for 10/9 with Rachel Holloway. PCP said she should see neurologist. Advised that the nurse would call if there was any other questions.

## 2015-11-04 NOTE — Telephone Encounter (Signed)
Please see patient first and then come get me

## 2015-11-04 NOTE — Telephone Encounter (Signed)
We see pt for dementia 6 mo RV.    Pcp is Dr. Louis MeckelParuchuri.

## 2015-11-07 ENCOUNTER — Ambulatory Visit (INDEPENDENT_AMBULATORY_CARE_PROVIDER_SITE_OTHER): Payer: Medicare Other | Admitting: Nurse Practitioner

## 2015-11-07 ENCOUNTER — Encounter: Payer: Self-pay | Admitting: Nurse Practitioner

## 2015-11-07 VITALS — BP 111/65 | HR 72 | Ht 61.0 in | Wt 137.6 lb

## 2015-11-07 DIAGNOSIS — R269 Unspecified abnormalities of gait and mobility: Secondary | ICD-10-CM | POA: Diagnosis not present

## 2015-11-07 DIAGNOSIS — F0280 Dementia in other diseases classified elsewhere without behavioral disturbance: Secondary | ICD-10-CM

## 2015-11-07 DIAGNOSIS — I951 Orthostatic hypotension: Secondary | ICD-10-CM

## 2015-11-07 DIAGNOSIS — G309 Alzheimer's disease, unspecified: Secondary | ICD-10-CM

## 2015-11-07 NOTE — Progress Notes (Signed)
[facility]  PATIENT: Rachel Holloway DOB: May 11, 1935   REASON FOR VISIT: Follow-up for dementia, gait abnormality, new complaint of dizziness HISTORY FROM: Husband and patient    HISTORY OF PRESENT ILLNESS: Rachel Holloway 80 yo RH, is accompanied by her husband, referred by her primary care physician Dr. 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41, moved from [place] to [place] in 2013 to be close to her children, family noticed gradual onset memory trouble since 2014, she tends to repeat herself, getting worse over the past few years, she enjoys walking her dogs, working on crossword puzzles, she has chronic low back pain, mild cautious gait She has quit driving since summer of 1610, using CPAP machine, which has helped her sleep quality, she denied loss sense of smells, no REM sleep disorder, no hallucinations,  There was no family history of dementia  UPDATE Feb 4th 2016:[name] is accompanied by her husband, and daughter at today's clinical visit, family reported that since 2015, she has gradual onset small shuffling gait, bending forward, she denied REM sleep disorder, she had a few years history of decreased sense of smell, she walks her dog every day, has frequent UTI, urinary urgency, Mini-Mental Status Examination is 23 out of 30 We have reviewed MRI of brain, mild to moderate generalized atrophy, mild small vessel disease, I have reviewed laboratory evaluation from her primary care's office, February 03 2014, elevated LDL 127, normal vitamin D 32, CMP, creatinine 1.0, CBC showed mildly low WBC 3.8, elevated MCV 104, UPDATE August 10th 2016:YY Her memory continues to get worse. Today's Mini-Mental Status Examination is 21 out of 30, with animal naming 7. She sleeps well, using CPAP machine, has good appetite, but both she and her husband are confused about her medications, last refill of Namenda was March 2016, she denied REM sleep disorder,  no loss of smell, mild small shuffling unsteady gait UPDATE Mar 14 2015:YY She is accompanied by her husband at today's clinical visit, reported she has worsening memory trouble, she can tolerate Namenda 10 mg twice a day well, use CPAP machine sleep well at daytime, good appetite, chronic low back pain, wear lumbar brace, mild gait difficulty, use walker, she denies bowel and bladder incontinence. There is no significant behavior issues.  UPDATE 05/16/2017CM [name], 80 year old female returns for follow-up she has a history of dementia and is currently on Namenda and placed on Aricept at her last visit. Husband brings in current medication list today and she is also on Exelon patch from primary care. She has long history of back pain wears a lumbar brace and has gait difficulty. There is no bowel or bladder incontinence. She also has a history of obstructive sleep apnea and uses CPAP at night. Appetite is fairly good she sleeps well no behavior issues no wandering behavior. No hallucinations. She returns for reevaluation UPDATE 10/09/2017CM [name], 80 year old female returns for follow-up with her husband. She has a history of dementia is currently on Namenda and Aricept. She also has obstructive sleep apnea and uses CPAP  followed by physician in [facility] according to the husband. She has a new complaint of dizziness today when changing positions from getting out of the bed or from a seated position to a standing position . She has not had any falls,  she uses a rolling walker. She has occasional bladder incontinence, no bowel incontinence. She has a glass of wine every day. Appetite is good and she  is sleeping well.  REVIEW OF SYSTEMS: Full 14 system review of systems performed and notable only for those listed, all others are neg:  Constitutional: neg  Cardiovascular: neg Ear/Nose/Throat: neg  Skin: neg Eyes: neg Respiratory: neg Gastroitestinal: Occasional incontinence of  bladder Hematology/Lymphatic: neg  Endocrine: neg Musculoskeletal:neg Allergy/Immunology: neg Neurological: Dizziness, memory loss Psychiatric: neg Sleep : neg   ALLERGIES: Allergies  Allergen Reactions  . Azithromycin   . Codeine Rash  . Erythromycin Nausea And Vomiting  . Penicillins Rash    HOME MEDICATIONS: Outpatient Medications Prior to Visit  Medication Sig Dispense Refill  . aspirin 81 MG tablet Take 81 mg by mouth daily.    Marland Kitchen. atorvastatin (LIPITOR) 80 MG tablet 80 mg daily.     Marland Kitchen. donepezil (ARICEPT) 10 MG tablet Take 1 tablet (10 mg total) by mouth at bedtime. 90 tablet 3  . DULoxetine (CYMBALTA) 60 MG capsule 60 mg daily.     Marland Kitchen. estradiol (ESTRACE) 2 MG tablet Take 2 mg by mouth daily.    . lansoprazole (PREVACID) 30 MG capsule Take 30 mg by mouth daily.    Marland Kitchen. lisinopril (PRINIVIL,ZESTRIL) 10 MG tablet 10 mg daily.     . memantine (NAMENDA) 10 MG tablet Take 1 tablet (10 mg total) by mouth 2 (two) times daily. 180 tablet 3  . naproxen (NAPROSYN) 500 MG tablet Take 500 mg by mouth 2 (two) times daily with a meal.    . trifluoperazine (STELAZINE) 1 MG tablet 1 mg 2 (two) times daily.     . Vitamin D, Ergocalciferol, (DRISDOL) 50000 UNITS CAPS capsule 50,000 Units every 7 (seven) days.      No facility-administered medications prior to visit.     PAST MEDICAL HISTORY: Past Medical History:  Diagnosis Date  . Anemia   . Back pain   . Dementia   . Fatigue   . Hyperlipemia   . Hypertension   . Vitamin D deficiency disease     PAST SURGICAL HISTORY: Past Surgical History:  Procedure Laterality Date  . ABDOMINAL HYSTERECTOMY    . CHOLECYSTECTOMY      FAMILY HISTORY: Family History  Problem Relation Age of Onset  . Heart Problems Mother   . Heart Problems Father   . Pulmonary embolism Father     SOCIAL HISTORY: Social History   Social History  . Marital status: Married    Spouse name: [name]  . Number of children: 2  . Years of education: RN    Occupational History  .      Retired Charity fundraiserN   Social History Main Topics  . Smoking status: Never Smoker  . Smokeless tobacco: Never Used  . Alcohol use 0.6 oz/week    1 Glasses of wine per week     Comment: daily  . Drug use: No  . Sexual activity: Not on file   Other Topics Concern  . Not on file   Social History Narrative   Patient lives at home with her husband Rayna Sexton(Ralph).   Retired Art therapistN   Education college.   Right handed.   Caffeine two cups of coffee daily.     PHYSICAL EXAM  Vitals:   11/07/15 1248  BP: 111/65 lying 100/60 seated and 90/50 standing  Pulse: 72  Weight: 137 lb 9.6 oz (62.4 kg)  Height: 5\' 1"  (1.549 m)   Body mass index is 26 kg/m.  Generalized: Well developed, female well groomed in no acute distress  Head: normocephalic and atraumatic,. Oropharynx benign  Neck: Supple, no carotid bruits  Cardiac: Regular rate rhythm, no murmur  Musculoskeletal: No deformity   Neurological examination   Mentation: Alert oriented to time, place, MMSE 15/30. AFT 3. Clock drawing 2/4  Follows all commands speech and language fluent.   Cranial nerve II-XII: Fundoscopic exam reveals sharp disc margins.Pupils were equal round reactive to light extraocular movements were full, visual field were full on confrontational test. Facial sensation and strength were normal. hearing was intact to finger rubbing bilaterally. Uvula tongue midline. head turning and shoulder shrug were normal and symmetric.Tongue protrusion into cheek strength was normal. Motor: normal bulk and tone, full strength in the BUE, 4/5 BLE, , no pronator drift.  Sensory: normal and symmetric to light touch, pinprick, and  Vibration, Coordination: finger-nose-finger,  no dysmetria Reflexes: Symmetric upper and lower plantar responses were flexor bilaterally. Gait and Station: Needs to push off from wheelchair, cautious mildly unsteady gait DIAGNOSTIC DATA (LABS, IMAGING, TESTING) -     ASSESSMENT AND  PLAN  80 y.o. year old female  has a past medical history of Anemia; Back pain; Dementia; Fatigue; Hyperlipemia; Hypertension; and Vitamin D deficiency disease. here to follow-up for her dementia and new complaint of dizziness. Blood pressure lying 111/65 seated 100/60 and standing 90/50.  PLAN:Blood pressure drops when you  change position, stay well hydrated and use walker at all times, when changing positions get your bearings prior to changing positions. Discussed the diagnosis of orthostatic hypotension Continue Namenda and Aricept does not need refills Follow up in 6 months next with Dr. Carmelina Noun, [facility], [facility], APRN [facility] [address], [address] [place], [place] [address] [phone]

## 2015-11-07 NOTE — Progress Notes (Signed)
I have reviewed and agreed above plan. 

## 2015-11-07 NOTE — Patient Instructions (Addendum)
Blood pressure drops when you  change position, stay well hydrated and Continue Namenda and Aricept does not need refills Follow up in 6 months next with Dr. Terrace ArabiaYan

## 2015-12-15 ENCOUNTER — Ambulatory Visit: Payer: Medicare Other | Admitting: Nurse Practitioner

## 2016-02-13 ENCOUNTER — Other Ambulatory Visit: Payer: Self-pay | Admitting: Neurology

## 2016-04-09 ENCOUNTER — Inpatient Hospital Stay (HOSPITAL_COMMUNITY)
Admission: EM | Admit: 2016-04-09 | Discharge: 2016-04-11 | DRG: 065 | Disposition: A | Discharge status: Hospice | Payer: Medicare Other | Attending: Family Medicine | Admitting: Family Medicine

## 2016-04-09 ENCOUNTER — Emergency Department (HOSPITAL_COMMUNITY): Payer: Medicare Other

## 2016-04-09 ENCOUNTER — Encounter (HOSPITAL_COMMUNITY): Payer: Self-pay

## 2016-04-09 DIAGNOSIS — Z7189 Other specified counseling: Secondary | ICD-10-CM

## 2016-04-09 DIAGNOSIS — R350 Frequency of micturition: Secondary | ICD-10-CM | POA: Diagnosis present

## 2016-04-09 DIAGNOSIS — I609 Nontraumatic subarachnoid hemorrhage, unspecified: Principal | ICD-10-CM

## 2016-04-09 DIAGNOSIS — R4701 Aphasia: Secondary | ICD-10-CM | POA: Diagnosis present

## 2016-04-09 DIAGNOSIS — I10 Essential (primary) hypertension: Secondary | ICD-10-CM | POA: Diagnosis present

## 2016-04-09 DIAGNOSIS — Z7982 Long term (current) use of aspirin: Secondary | ICD-10-CM | POA: Diagnosis not present

## 2016-04-09 DIAGNOSIS — Z515 Encounter for palliative care: Secondary | ICD-10-CM | POA: Diagnosis present

## 2016-04-09 DIAGNOSIS — E876 Hypokalemia: Secondary | ICD-10-CM | POA: Diagnosis present

## 2016-04-09 DIAGNOSIS — Z66 Do not resuscitate: Secondary | ICD-10-CM | POA: Diagnosis present

## 2016-04-09 DIAGNOSIS — Z90710 Acquired absence of both cervix and uterus: Secondary | ICD-10-CM

## 2016-04-09 DIAGNOSIS — I615 Nontraumatic intracerebral hemorrhage, intraventricular: Secondary | ICD-10-CM | POA: Diagnosis present

## 2016-04-09 DIAGNOSIS — Z9049 Acquired absence of other specified parts of digestive tract: Secondary | ICD-10-CM | POA: Diagnosis not present

## 2016-04-09 DIAGNOSIS — Z79899 Other long term (current) drug therapy: Secondary | ICD-10-CM

## 2016-04-09 DIAGNOSIS — E559 Vitamin D deficiency, unspecified: Secondary | ICD-10-CM | POA: Diagnosis present

## 2016-04-09 DIAGNOSIS — F0390 Unspecified dementia without behavioral disturbance: Secondary | ICD-10-CM | POA: Diagnosis present

## 2016-04-09 DIAGNOSIS — G910 Communicating hydrocephalus: Secondary | ICD-10-CM | POA: Diagnosis present

## 2016-04-09 DIAGNOSIS — E785 Hyperlipidemia, unspecified: Secondary | ICD-10-CM | POA: Diagnosis present

## 2016-04-09 LAB — COMPREHENSIVE METABOLIC PANEL
ALT: 21 U/L (ref 14–54)
ANION GAP: 11 (ref 5–15)
AST: 26 U/L (ref 15–41)
Albumin: 3.7 g/dL (ref 3.5–5.0)
Alkaline Phosphatase: 76 U/L (ref 38–126)
BILIRUBIN TOTAL: 0.7 mg/dL (ref 0.3–1.2)
BUN: 12 mg/dL (ref 6–20)
CHLORIDE: 111 mmol/L (ref 101–111)
CO2: 24 mmol/L (ref 22–32)
Calcium: 9.4 mg/dL (ref 8.9–10.3)
Creatinine, Ser: 1.01 mg/dL — ABNORMAL HIGH (ref 0.44–1.00)
GFR, EST AFRICAN AMERICAN: 60 mL/min — AB (ref >60)
GFR, EST NON AFRICAN AMERICAN: 52 mL/min — AB (ref >60)
Glucose, Bld: 113 mg/dL — ABNORMAL HIGH (ref 65–99)
POTASSIUM: 3.4 mmol/L — AB (ref 3.5–5.1)
Sodium: 146 mmol/L — ABNORMAL HIGH (ref 135–145)
TOTAL PROTEIN: 6.8 g/dL (ref 6.5–8.1)

## 2016-04-09 LAB — URINALYSIS, ROUTINE W REFLEX MICROSCOPIC
BILIRUBIN URINE: NEGATIVE
Glucose, UA: NEGATIVE mg/dL
Hgb urine dipstick: NEGATIVE
KETONES UR: NEGATIVE mg/dL
LEUKOCYTES UA: NEGATIVE
NITRITE: NEGATIVE
PH: 7 (ref 5.0–8.0)
PROTEIN: NEGATIVE mg/dL
Specific Gravity, Urine: 1.009 (ref 1.005–1.030)

## 2016-04-09 LAB — CBC
HEMATOCRIT: 39.8 % (ref 36.0–46.0)
HEMOGLOBIN: 13.2 g/dL (ref 12.0–15.0)
MCH: 33.3 pg (ref 26.0–34.0)
MCHC: 33.2 g/dL (ref 30.0–36.0)
MCV: 100.5 fL — ABNORMAL HIGH (ref 78.0–100.0)
Platelets: 213 10*3/uL (ref 150–400)
RBC: 3.96 MIL/uL (ref 3.87–5.11)
RDW: 12.4 % (ref 11.5–15.5)
WBC: 7.6 10*3/uL (ref 4.0–10.5)

## 2016-04-09 LAB — DIFFERENTIAL
BASOS ABS: 0 10*3/uL (ref 0.0–0.1)
BASOS PCT: 0 %
EOS ABS: 0 10*3/uL (ref 0.0–0.7)
EOS PCT: 0 %
LYMPHS ABS: 1 10*3/uL (ref 0.7–4.0)
Lymphocytes Relative: 14 %
MONOS PCT: 7 %
Monocytes Absolute: 0.6 10*3/uL (ref 0.1–1.0)
NEUTROS PCT: 79 %
Neutro Abs: 6 10*3/uL (ref 1.7–7.7)

## 2016-04-09 LAB — I-STAT TROPONIN, ED: TROPONIN I, POC: 0 ng/mL (ref 0.00–0.08)

## 2016-04-09 LAB — RAPID URINE DRUG SCREEN, HOSP PERFORMED
Amphetamines: NOT DETECTED
Barbiturates: NOT DETECTED
Benzodiazepines: NOT DETECTED
Cocaine: NOT DETECTED
Opiates: NOT DETECTED
TETRAHYDROCANNABINOL: NOT DETECTED

## 2016-04-09 LAB — ETHANOL

## 2016-04-09 LAB — PROTIME-INR
INR: 1.02
Prothrombin Time: 13.4 seconds (ref 11.4–15.2)

## 2016-04-09 LAB — CBG MONITORING, ED: GLUCOSE-CAPILLARY: 93 mg/dL (ref 65–99)

## 2016-04-09 LAB — APTT: APTT: 24 s (ref 24–36)

## 2016-04-09 MED ORDER — LEVETIRACETAM 500 MG/5ML IV SOLN
500.0000 mg | Freq: Two times a day (BID) | INTRAVENOUS | Status: DC
  Administered 2016-04-09 – 2016-04-11 (×4): 500 mg via INTRAVENOUS
  Filled 2016-04-09 (×4): qty 5

## 2016-04-09 MED ORDER — POTASSIUM CHLORIDE IN NACL 20-0.9 MEQ/L-% IV SOLN
INTRAVENOUS | Status: DC
  Administered 2016-04-09 – 2016-04-10 (×3): via INTRAVENOUS
  Filled 2016-04-09 (×3): qty 1000

## 2016-04-09 MED ORDER — ACETAMINOPHEN 325 MG PO TABS
650.0000 mg | ORAL_TABLET | Freq: Four times a day (QID) | ORAL | Status: DC | PRN
  Filled 2016-04-09: qty 2

## 2016-04-09 MED ORDER — ACETAMINOPHEN 650 MG RE SUPP
650.0000 mg | Freq: Four times a day (QID) | RECTAL | Status: DC | PRN
Start: 2016-04-09 — End: 2016-04-11
  Administered 2016-04-10 (×2): 650 mg via RECTAL
  Filled 2016-04-09 (×3): qty 1

## 2016-04-09 MED ORDER — MORPHINE SULFATE (PF) 2 MG/ML IV SOLN: 1.0000 mg | INTRAVENOUS | Status: DC | PRN

## 2016-04-09 MED ORDER — ONDANSETRON HCL 4 MG/2ML IJ SOLN: 4.0000 mg | Freq: Four times a day (QID) | INTRAMUSCULAR | Status: DC | PRN

## 2016-04-09 MED ORDER — LORAZEPAM 2 MG/ML IJ SOLN: 0.5000 mg | INTRAMUSCULAR | Status: DC | PRN

## 2016-04-09 NOTE — ED Notes (Signed)
ED Provider at bedside. 

## 2016-04-09 NOTE — ED Notes (Signed)
Pt family requesting water for the pt. Rn informed the family that the pt cannot have anything to drink until evaluated by speech therapy. Moist mouth swabs provided for the pt.

## 2016-04-09 NOTE — ED Notes (Signed)
Xray at bedside to do portable chest xray 

## 2016-04-09 NOTE — ED Triage Notes (Signed)
Pt presents from home via EMS with slurred speech and urinary incontinence that is worse than normal. Per EMS, pt was up with help from her husband at 3 AM last night to use the restroom. Upon waking this AM, family noticed pt had wet the bed more frequently than normal and pt was slurring her speech and also more tired than usual. Per EMS, pt able to answer 'yes" and "no". 12-lead unremarkable. 160/82, HR 82, CBG 150

## 2016-04-09 NOTE — ED Provider Notes (Signed)
Emergency Department Provider Note   I have reviewed the triage vital signs and the nursing notes.  Level 5 caveat: AMS and aphasia and dementia  HISTORY  Chief Complaint Neurologic Problem and Aphasia   HPI Rachel Holloway is a 81 y.o. female with PMH of HLD, HTN, and dementia presents to the emergency department for evaluation of new onset slurred speech and urinary frequency. The patient has baseline dementia. Per EMS she was last known normal at [facility] when she got up in the night to go the bathroom. This morning family noticed that she had significant speech change she described as slurring, more confusion, and urinary incontinence.   Level 5 caveat: Aphasia, confusion/dementia.    Past Medical History:  Diagnosis Date  . Anemia   . Back pain   . Dementia   . Fatigue   . Hyperlipemia   . Hypertension   . Vitamin D deficiency disease     Patient Active Problem List   Diagnosis Date Noted  . Subarachnoid hemorrhage (HCC) 04/09/2016  . Hypokalemia 04/09/2016  . SAH (subarachnoid hemorrhage) (HCC) 04/09/2016  . Orthostatic hypotension 11/07/2015  . Lumbago 03/04/2014  . Gait difficulty 03/04/2014  . Dementia 02/11/2014    Past Surgical History:  Procedure Laterality Date  . ABDOMINAL HYSTERECTOMY    . CHOLECYSTECTOMY        Allergies Azithromycin; Codeine; Erythromycin; and Penicillins  Family History  Problem Relation Age of Onset  . Heart Problems Mother   . Heart Problems Father   . Pulmonary embolism Father     Social History Social History  Substance Use Topics  . Smoking status: Never Smoker  . Smokeless tobacco: Never Used  . Alcohol use 0.6 oz/week    1 Glasses of wine per week     Comment: daily    Review of Systems  Level 5 caveat: Dementia and aphasia  ____________________________________________   PHYSICAL EXAM:  VITAL SIGNS: ED Triage Vitals  Enc Vitals Group     BP 04/09/16 1512 (!) 171/50     Pulse Rate 04/09/16 1512 71   Resp 04/09/16 1512 19     Temp 04/09/16 1512 99.4 F (37.4 C)     Temp Source 04/09/16 1512 Rectal     SpO2 04/09/16 1512 99 %     Weight 04/09/16 1504 135 lb (61.2 kg)   Constitutional: Drowsy and confused. Appears acutely ill.  Eyes: Conjunctivae are normal. PERRL.  Head: Atraumatic. Nose: No congestion/rhinnorhea. Mouth/Throat: Mucous membranes are moist.  Neck: No stridor.  Cardiovascular: Normal rate, regular rhythm. Good peripheral circulation. Grossly normal heart sounds.   Respiratory: Normal respiratory effort.  No retractions. Lungs CTAB. Gastrointestinal: Soft and nontender. No distention.  Musculoskeletal: No lower extremity tenderness nor edema. No gross deformities of extremities. Neurologic: Confused speech and very somnolent. Notable slurred speech. LUE weakness.   Skin:  Skin is warm, dry and intact. No rash noted.  ____________________________________________   LABS (all labs ordered are listed, but only abnormal results are displayed)  Labs Reviewed  CBC - Abnormal; Notable for the following:       Result Value   MCV 100.5 (*)    All other components within normal limits  COMPREHENSIVE METABOLIC PANEL - Abnormal; Notable for the following:    Sodium 146 (*)    Potassium 3.4 (*)    Glucose, Bld 113 (*)    Creatinine, Ser 1.01 (*)    GFR calc non Af Amer 52 (*)    GFR calc  Af Amer 60 (*)    All other components within normal limits  URINALYSIS, ROUTINE W REFLEX MICROSCOPIC - Abnormal; Notable for the following:    Color, Urine STRAW (*)    All other components within normal limits  PROTIME-INR  APTT  DIFFERENTIAL  ETHANOL  RAPID URINE DRUG SCREEN, HOSP PERFORMED  CBG MONITORING, ED  I-STAT TROPOININ, ED  I-STAT CHEM 8, ED  I-STAT TROPOININ, ED   ____________________________________________  EKG   EKG Interpretation  Date/Time:  Monday April 09 2016 15:10:45 EDT Ventricular Rate:  74 PR Interval:    QRS Duration: 74 QT Interval:  412 QTC  Calculation: 454 R Axis:   68 Text Interpretation:  Sinus rhythm Borderline repolarization abnormality No STEMI.  Confirmed by Lethia Donlon MD, Sheila Ocasio [phone]) on 04/09/2016 3:17:41 PM       ____________________________________________  RADIOLOGY  Ct Head Wo Contrast  Result Date: 04/09/2016 CLINICAL DATA:  Slurred speech.  Unresponsive. EXAM: CT HEAD WITHOUT CONTRAST TECHNIQUE: Contiguous axial images were obtained from the base of the skull through the vertex without intravenous contrast. COMPARISON:  02/25/2014 brain MRI. FINDINGS: Brain: There is large volume acute subarachnoid hemorrhage throughout the medial bifrontal sulci, bilateral sylvian fissures, bilateral perimesencephalic cisterns, interpeduncular cistern and suprasellar cistern. There is layering acute hemorrhage in the occipital horns of both lateral ventricles. There is symmetric dilatation of the lateral ventricles and of the third ventricle. Prominent periventricular white matter hypodensity bilaterally. No significant midline shift. Generalized mild cerebral volume loss. Vascular: No hyperdense vessel or unexpected calcification. Skull: No evidence of calvarial fracture. Sinuses/Orbits: The visualized paranasal sinuses are essentially clear. Other:  The mastoid air cells are unopacified. IMPRESSION: 1. Large volume acute subarachnoid hemorrhage throughout the basilar cisterns, bilateral sylvian fissures and medial bifrontal sulci. 2. Layering intraventricular hemorrhage in the occipital horns of the lateral ventricles. 3. 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 4. No significant midline shift. Critical Value/emergent results were called by telephone at the time of interpretation on 04/09/2016 at 4:40 pm to Dr. Alona Bene , who verbally acknowledged these results. Electronically Signed   By: Delbert Phenix M.D.   On: 04/09/2016 16:42   Dg Chest Portable 1  View  Result Date: 04/09/2016 CLINICAL DATA:  Slurred speech. EXAM: PORTABLE CHEST 1 VIEW COMPARISON:  No recent prior. FINDINGS: Mediastinum and hilar structures are normal. Mild left base subsegmental atelectasis and/or scarring. Calcified nodular density noted in the right lung base consistent with granuloma. Biapical pleural-parenchymal thickening consistent with scarring. IMPRESSION: Mild left base subsegmental atelectasis and/or scarring. Mild biapical pleuroparenchymal thickening consistent with scarring. Electronically Signed   By: Maisie Fus  Register   On: 04/09/2016 16:18    ____________________________________________   PROCEDURES  Procedure(s) performed:   Procedures  CRITICAL CARE Performed by: Maia Plan Total critical care time: 60 minutes Critical care time was exclusive of separately billable procedures and treating other patients. Critical care was necessary to treat or prevent imminent or life-threatening deterioration. Critical care was time spent personally by me on the following activities: development of treatment plan with patient and/or surrogate as well as nursing, discussions with consultants, evaluation of patient's response to treatment, examination of patient, obtaining history from patient or surrogate, ordering and performing treatments and interventions, ordering and review of laboratory studies, ordering and review of radiographic studies, pulse oximetry and re-evaluation of patient's condition.  Alona Bene, MD Emergency Medicine  ____________________________________________   INITIAL IMPRESSION / ASSESSMENT AND PLAN / ED  COURSE  Pertinent labs & imaging results that were available during my care of the patient were reviewed by me and considered in my medical decision making (see chart for details).  Patient resents to the emergency department for evaluation of strokelike symptoms. She was last known normal at 3 AM. She presented to the emergency  department was triaged approximately 3 PM but outside of the code stroke window. Patient is a complicating history of underlying dementia and description per EMS that the patient is having significant urinary frequency and incontinence.   04:40 PM Spoke with radiology regarding the CT head. The patient has large volume subarachnoid blood. I updated the family have the patient's power of attorney paperwork at bedside. I discussed that the bleed is likely caused from an aneurysm. I paged Neurosurgery to discuss the CT and get assessment of patient's clinical outlook. We discussed intubation but will hold off with POA paperwork describing not wanting life-prolonging measures if she encounters an incurable disease process. Patient family not ready to make patient DNR/DNI prior to discussing with NSG. No hypoxemia at this time.   04:50 PM Spoke with Dr. Dutch QuintPoole with Neurosurgery regarding the CT and presentation. He will be down to discuss with the family. Updated the daughter who is making plans for the patient's husband to be in the ED to help with decision making. The daughter is listed as the POA. Will hold on intubation until family discussion with Dr. Dutch QuintPoole.   05:20 PM Dr. Dutch QuintPoole has spoken with family who does not want to undergo intubation and ventriculostomy. Placed Palliative care consult and paged the unassigned team for admission for supportive care.   05:40 PM Discussed patient's case with the hospitalist. Patient and family (if present) updated with plan. Care transferred to hospitlaist service.  I reviewed all nursing notes, vitals, pertinent old records, EKGs, labs, imaging (as available).  ____________________________________________  FINAL CLINICAL IMPRESSION(S) / ED DIAGNOSES  Final diagnoses:  SAH (subarachnoid hemorrhage) (HCC)     MEDICATIONS GIVEN DURING THIS VISIT:  Medications  0.9 % NaCl with KCl 20 mEq/ L  infusion (not administered)  acetaminophen (TYLENOL) tablet 650  mg (not administered)    Or  acetaminophen (TYLENOL) suppository 650 mg (not administered)  levETIRAcetam (KEPPRA) 500 mg in sodium chloride 0.9 % 100 mL IVPB (not administered)  ondansetron (ZOFRAN) injection 4 mg (not administered)     NEW OUTPATIENT MEDICATIONS STARTED DURING THIS VISIT:  None   Note:  This document was prepared using Dragon voice recognition software and may include unintentional dictation errors.  Alona BeneJoshua Yachet Mattson, MD Emergency Medicine   Maia PlanJoshua G Tennyson Wacha, MD 04/09/16 2006

## 2016-04-09 NOTE — ED Notes (Signed)
Family at bedside. 

## 2016-04-09 NOTE — ED Notes (Signed)
Patient back from CT.  Hooked back to the monitor and family updated on plan of waiting on CT results.  Vitals stable with BP slightly hypertensive.  Patient sleeping with snoring noted.

## 2016-04-09 NOTE — H&P (Signed)
TRH H&P   Patient Demographics:    Rachel Holloway, is a 81 y.o. female  MRN: [id number]   DOB - [birth year]  Admit Date - 04/09/2016  Outpatient Primary MD for the patient is Paruchuri, Janace Hoard, MD  Referring MD: Dr Jacqulyn Bath    Patient coming from:  home  Chief Complaint  Patient presents with  . Neurologic Problem  . Aphasia      HPI:    Rachel Holloway  is a 81 y.o. female, With mild-to-moderate dementia, hypertension and hyperlipidemia brought to the ED for new onset slurred speech, confusion and urinary frequency. She was last known normal to be around 3 in the morning when she got up to go to the bathroom. Uses a walker to ambulate short distances at baseline. Husband found that she was sleeping until 9 in the morning which was unusual, poorly responding with slurred speech . Patient did not have fever, chills, complained of headache, nausea, vomiting, chest pain, shortness of breath, abdominal pain, diarrhea, recent illness, fall, recent travel or change in her medications.  In the ED vitals were stable. Blood work showed normal CBC, sodium of 146, K of 3.4 normal renal function. Head CT showed 1. Large volume acute subarachnoid hemorrhage throughout the basilar cisterns, bilateral sylvian fissures and medial bifrontal sulci. 2. Layering intraventricular hemorrhage in the occipital horns of the lateral ventricles.  Neurosurgeon Dr. Dutch Quint was consulted who spoke with patient's husband and daughter at bedside. Discussed options for ventriculostomy. Family after being explained the risks versus benefit, overall prognosis and possible complications decided against any intervention or aggressive measures and wanted her to be full comfort care.     Review of systems:    As outlined in history of present illness. Full review of systems otherwise unremarkable.   With Past History of  the following :    Past Medical History:  Diagnosis Date  . Anemia   . Back pain   . Dementia   . Fatigue   . Hyperlipemia   . Hypertension   . Vitamin D deficiency disease       Past Surgical History:  Procedure Laterality Date  . ABDOMINAL HYSTERECTOMY    . CHOLECYSTECTOMY        Social History:     Social History  Substance Use Topics  . Smoking status: Never Smoker  . Smokeless tobacco: Never Used  . Alcohol use 0.6 oz/week    1 Glasses of wine per week     Comment: daily     Lives - Home with husband  Mobility - ambulatory with a walker.     Family History :     Family History  Problem Relation Age of Onset  . Heart Problems Mother   . Heart Problems Father   . Pulmonary embolism Father       Home Medications:   Prior  to Admission medications   Medication Sig Start Date End Date Taking? Authorizing Provider  aspirin 81 MG tablet Take 81 mg by mouth daily.   Yes Historical Provider, MD  atorvastatin (LIPITOR) 80 MG tablet 80 mg daily.  08/10/14  Yes Historical Provider, MD  lisinopril (PRINIVIL,ZESTRIL) 10 MG tablet 10 mg daily.  08/06/14  Yes Historical Provider, MD  memantine (NAMENDA) 10 MG tablet Take 1 tablet (10 mg total) by mouth 2 (two) times daily. 03/14/15  Yes Levert Feinstein, MD  rivastigmine (EXELON) 4.6 mg/24hr Place 4.6 mg onto the skin daily.   Yes Historical Provider, MD  Vitamin D, Ergocalciferol, (DRISDOL) 50000 UNITS CAPS capsule 50,000 Units every 7 (seven) days. No specific day per husband 07/05/14  Yes Historical Provider, MD  donepezil (ARICEPT) 10 MG tablet TAKE 1 TABLET BY MOUTH AT BEDTIME Patient not taking: Reported on 04/09/2016 02/13/16   Levert Feinstein, MD     Allergies:     Allergies  Allergen Reactions  . Azithromycin   . Codeine Rash  . Erythromycin Nausea And Vomiting  . Penicillins Rash    Has patient had a PCN reaction causing immediate rash, facial/tongue/throat swelling, SOB or lightheadedness with hypotension: YES Has  patient had a PCN reaction causing severe rash involving mucus membranes or skin necrosis: NO Has patient had a PCN reaction that required hospitalization NO Has patient had a PCN reaction occurring within the last 10 years: NO If all of the above answers are "NO", then may proceed with Cephalosporin use.      Physical Exam:   Vitals  Blood pressure 162/62, pulse 73, temperature 99.4 F (37.4 C), temperature source Rectal, resp. rate 23, weight 61.2 kg (135 lb), SpO2 100 %.   Gen.: Elderly female lying in bed somnolent and snoring, minimally responding to command with occasional eye opening, nonverbal (as per daughter she was occasionally speaking while in the ED and moving her extremities). HEENT: Pupils reactive bilaterally, moist mucosa, supple neck Chest: Clear to auscultation bilaterally CVS: Normal S1 and S2, no murmurs rub or gallop GI: Soft, nondistended, nontender, bowel sounds present  musculoskeletal: Warm, no edema CNS: Somnolent, minimally arousable, normal muscle tone and reflexes    Data Review:    CBC  Recent Labs Lab 04/09/16 1521  WBC 7.6  HGB 13.2  HCT 39.8  PLT 213  MCV 100.5*  MCH 33.3  MCHC 33.2  RDW 12.4  LYMPHSABS 1.0  MONOABS 0.6  EOSABS 0.0  BASOSABS 0.0   ------------------------------------------------------------------------------------------------------------------  Chemistries   Recent Labs Lab 04/09/16 1521  NA 146*  K 3.4*  CL 111  CO2 24  GLUCOSE 113*  BUN 12  CREATININE 1.01*  CALCIUM 9.4  AST 26  ALT 21  ALKPHOS 76  BILITOT 0.7   ------------------------------------------------------------------------------------------------------------------ estimated creatinine clearance is 37.9 mL/min (by C-G formula based on SCr of 1.01 mg/dL (H)). ------------------------------------------------------------------------------------------------------------------ No results for input(s): TSH, T4TOTAL, T3FREE, THYROIDAB in the  last 72 hours.  Invalid input(s): FREET3  Coagulation profile  Recent Labs Lab 04/09/16 1521  INR 1.02   ------------------------------------------------------------------------------------------------------------------- No results for input(s): DDIMER in the last 72 hours. -------------------------------------------------------------------------------------------------------------------  Cardiac Enzymes No results for input(s): CKMB, TROPONINI, MYOGLOBIN in the last 168 hours.  Invalid input(s): CK ------------------------------------------------------------------------------------------------------------------ No results found for: BNP   ---------------------------------------------------------------------------------------------------------------  Urinalysis    Component Value Date/Time   COLORURINE STRAW (A) 04/09/2016 1655   APPEARANCEUR CLEAR 04/09/2016 1655   LABSPEC 1.009 04/09/2016 1655   PHURINE 7.0 04/09/2016 1655  GLUCOSEU NEGATIVE 04/09/2016 1655   HGBUR NEGATIVE 04/09/2016 1655   BILIRUBINUR NEGATIVE 04/09/2016 1655   KETONESUR NEGATIVE 04/09/2016 1655   PROTEINUR NEGATIVE 04/09/2016 1655   UROBILINOGEN 0.2 11/26/2012 0724   NITRITE NEGATIVE 04/09/2016 1655   LEUKOCYTESUR NEGATIVE 04/09/2016 1655    ----------------------------------------------------------------------------------------------------------------   Imaging Results:    Ct Head Wo Contrast  Result Date: 04/09/2016 CLINICAL DATA:  Slurred speech.  Unresponsive. EXAM: CT HEAD WITHOUT CONTRAST TECHNIQUE: Contiguous axial images were obtained from the base of the skull through the vertex without intravenous contrast. COMPARISON:  02/25/2014 brain MRI. FINDINGS: Brain: There is large volume acute subarachnoid hemorrhage throughout the medial bifrontal sulci, bilateral sylvian fissures, bilateral perimesencephalic cisterns, interpeduncular cistern and suprasellar cistern. There is layering acute  hemorrhage in the occipital horns of both lateral ventricles. There is symmetric dilatation of the lateral ventricles and of the third ventricle. Prominent periventricular white matter hypodensity bilaterally. No significant midline shift. Generalized mild cerebral volume loss. Vascular: No hyperdense vessel or unexpected calcification. Skull: No evidence of calvarial fracture. Sinuses/Orbits: The visualized paranasal sinuses are essentially clear. Other:  The mastoid air cells are unopacified. IMPRESSION: 1. Large volume acute subarachnoid hemorrhage throughout the basilar cisterns, bilateral sylvian fissures and medial bifrontal sulci. 2. Layering intraventricular hemorrhage in the occipital horns of the lateral ventricles. 3. 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 4. No significant midline shift. Critical Value/emergent results were called by telephone at the time of interpretation on 04/09/2016 at 4:40 pm to Dr. Alona BeneJOSHUA LONG , who verbally acknowledged these results. Electronically Signed   By: Delbert PhenixJason A Poff M.D.   On: 04/09/2016 16:42   Dg Chest Portable 1 View  Result Date: 04/09/2016 CLINICAL DATA:  Slurred speech. EXAM: PORTABLE CHEST 1 VIEW COMPARISON:  No recent prior. FINDINGS: Mediastinum and hilar structures are normal. Mild left base subsegmental atelectasis and/or scarring. Calcified nodular density noted in the right lung base consistent with granuloma. Biapical pleural-parenchymal thickening consistent with scarring. IMPRESSION: Mild left base subsegmental atelectasis and/or scarring. Mild biapical pleuroparenchymal thickening consistent with scarring. Electronically Signed   By: Maisie Fushomas  Register   On: 04/09/2016 16:18      Assessment & Plan:    Principal Problem:   Subarachnoid hemorrhage (HCC) Admit to MedSurg. Overall prognosis is guarded. Family against further aggressive measures including ventilator support,  artificial feeding, ACLS medications, lab draws or antibiotics. -They are aware about overall prognosis and possible complications. They want the patient to be fully comfortable and before hospice care. Palliative care consulted by ED physician. After discussing with family and started her on gentle IV normal saline for hydration, empiric IV Keppra for seizure prophylaxis. Comfort measures including low-dose IV morphine for pain and shortness of breath, Ativan when necessary for seizures and agitation.    Active Problems:   Dementia    Hypokalemia Replenish with fluids       DVT Prophylaxis none  Family Communication: Admission, patients condition and plan of care including tests being ordered have been discussed with the patient husband and daughter at bedside   Code Status DNR, full comfort measures    Condition GUARDED    Consults called:  Neurosurgery  pall care   Admission status: inpatient    Time spent in minutes : 60   Eddie NorthHUNGEL, Prapti Grussing M.D on 04/09/2016 at 6:25 PM  Between 7am to 7pm - Pager - [phone]. After 7pm go to [email or web address] - password [email or web address]  [facility] - Office  [phone]     

## 2016-04-09 NOTE — ED Notes (Signed)
Patient transported to CT 

## 2016-04-09 NOTE — ED Notes (Signed)
Admitting provider at bedside.

## 2016-04-09 NOTE — ED Notes (Signed)
Attempted to call report x1, no answer. 

## 2016-04-10 DIAGNOSIS — Z7189 Other specified counseling: Secondary | ICD-10-CM

## 2016-04-10 DIAGNOSIS — Z515 Encounter for palliative care: Secondary | ICD-10-CM

## 2016-04-10 LAB — MRSA PCR SCREENING: MRSA by PCR: NEGATIVE

## 2016-04-10 MED ORDER — LORAZEPAM 2 MG/ML IJ SOLN: 0.5000 mg | INTRAMUSCULAR | Status: AC | PRN

## 2016-04-10 MED ORDER — SODIUM CHLORIDE 0.9 % IV SOLN: 500.0000 mg | Freq: Two times a day (BID) | INTRAVENOUS | Status: AC

## 2016-04-10 MED ORDER — BISACODYL 10 MG RE SUPP: 10.0000 mg | Freq: Every day | RECTAL | Status: DC | PRN

## 2016-04-10 MED ORDER — BISACODYL 10 MG RE SUPP: 10.0000 mg | Freq: Every day | RECTAL | Status: AC | PRN

## 2016-04-10 MED ORDER — GLYCOPYRROLATE 0.2 MG/ML IJ SOLN: 0.1000 mg | INTRAMUSCULAR | Status: DC | PRN

## 2016-04-10 MED ORDER — MORPHINE SULFATE (PF) 2 MG/ML IV SOLN: 1.0000 mg | INTRAVENOUS | 0 refills | Status: AC | PRN

## 2016-04-10 MED ORDER — GLYCOPYRROLATE 0.2 MG/ML IJ SOLN: 0.1000 mg | INTRAMUSCULAR | Status: AC | PRN

## 2016-04-10 MED ORDER — ONDANSETRON HCL 4 MG/2ML IJ SOLN: 4.0000 mg | Freq: Four times a day (QID) | INTRAMUSCULAR | Status: AC | PRN

## 2016-04-10 NOTE — Care Management Note (Signed)
Case Management Note  Patient Details  Name: Rachel Holloway MRN: [id number] Date of Birth: November 21, 1935  Subjective/Objective:                    Action/Plan: [facility]. No further needs per CM.   Expected Discharge Date:  04/10/16               Expected Discharge Plan:  Hospice Medical Facility  In-House Referral:  Clinical Social Work  Discharge planning Services     Post Acute Care Choice:    Choice offered to:     DME Arranged:    DME Agency:     HH Arranged:    HH Agency:     Status of Service:  Completed, signed off  If discussed at [employer] Length of [employer] Meetings, dates discussed:    Additional Comments:  Kermit BaloKelli F Leitha Hyppolite, RN 04/10/2016, 2:00 PM

## 2016-04-10 NOTE — Consult Note (Signed)
Consultation Note Date: 04/10/2016   Patient Name: Rachel Holloway  DOB: 07/03/1935  MRN: [id number]  Age / Sex: 81 y.o., female  PCP: Charlette CaffeyLakshmi P Paruchuri, MD Referring Physician: Narda Bondsalph A Nettey, MD  Reason for Consultation: Disposition, Psychosocial/spiritual support and Terminal Care  HPI/Patient Profile: 81 y.o. female  with past medical history of mild to moderate dementia, HTN, and HLD. She presented to the ER after her husband found her to have slurred speech and poorly responding after waking her up. She was subsequently admitted on 04/09/2016 when CT head showed a large volume acute subarachnoid hemorrhage, layering intraventricular hemorrhage, and probably early communicating hydrocephalus. Neurology consulted and discussed results with family (husband and daughter); family decided against aggressive measures and opted for full comfort care. Palliative consulted to assist in supporting comfort care and disposition.   Clinical Assessment and Goals of Care: Rachel Holloway was very lethargic during my visit. She would open her eyes to voice, but quickly fell back asleep. When awake, she was socially interactive, and attempted to answer simple questions. Her responses were slurred and garbled, but the words I could understand were consistent. She was not able to follow simple commands. She consistently asked for water.  Her daughter, [name], was at her bedside. [name] is a Child psychotherapistocial Worker and relates that she is the Public relations account executivemain decision-maker for her mother. The patient's husband (beth's father) defers to her. We talked about her mother's clinical situation; [name] had a very good understanding of the significance of the hemorrhage and the prognosis. I talked through the elements that portended a poor prognosis, including her lethargy and minimal oral intake. Beth relates that comfort is the sole priority at this point. Her mother was clear on not wanting aggressive or invasive  interventions. Given Laconya's lethargy and minimal intake, we discussed residential Hospice, which Beth felt was the best option.   Finally, we did discuss comfort measures. I explained the role of medications in symptom management, and encouraged Beth to notify care nurse with concerns. We also discussed comfort feeds. Regardless of the results of a swallow evaluation, Beth would not want to withhold food/fluid or augment it with thickeners due to palatability. Consequently, I educated on optimizing safety around eating, and changed diet to regular with comfort feeds. Beth does understand the aspiration risk, but feels the benefit of comfort feeding outweighs the risks.   Primary Decision Maker NEXT OF KIN. Pt's husband is Horticulturist, commerciallegal decision maker, however he defers to his daughter, [name].    SUMMARY OF RECOMMENDATIONS    DNR, Full comfort care  No labs, no imaging, no aggressive/invasive interventions, limit vital sign check to qshift  Regular diet with thin liquids, Beth accepts risk (I reinforced safety recommendations); do not need speech eval given goal is comfort feeds  SW consulted for residential hospice referral   Code Status/Advance Care Planning:  DNR  Symptom Management:   Presently no uncontrolled symptoms  Mild pain/Fever: Tylenol Seizure prophylaxis: Keppra Anxiety/Agitation/Seizures: Ativan Dyspnea/Moderate pain: Morphine Nausea: Zofran Secretions: Robinul Constipation: Dulcolax  Additional Recommendations (Limitations, Scope, Preferences):  Full Comfort Care  Psycho-social/Spiritual:   Desire for further Chaplaincy support:no  Additional Recommendations: Education on Hospice  Prognosis:   < 4 weeks  Discharge Planning: Hospice facility      Primary Diagnoses: Present on Admission: . Dementia . Hypokalemia   I have reviewed the medical record, interviewed the patient and family, and examined the patient. The following aspects are pertinent.  Past  Medical History:  Diagnosis Date  . Anemia   .  Back pain   . Dementia   . Fatigue   . Hyperlipemia   . Hypertension   . Vitamin D deficiency disease    Social History   Social History  . Marital status: Married    Spouse name: Rayna Sexton  . Number of children: 2  . Years of education: RN   Occupational History  .      Retired Charity fundraiser   Social History Main Topics  . Smoking status: Never Smoker  . Smokeless tobacco: Never Used  . Alcohol use 0.6 oz/week    1 Glasses of wine per week     Comment: daily  . Drug use: No  . Sexual activity: Not Asked   Other Topics Concern  . None   Social History Narrative   Patient lives at home with her husband Rayna Sexton).   Retired Art therapist.   Right handed.   Caffeine two cups of coffee daily.   Family History  Problem Relation Age of Onset  . Heart Problems Mother   . Heart Problems Father   . Pulmonary embolism Father    Scheduled Meds: . levETIRAcetam  500 mg Intravenous Q12H   Continuous Infusions: . 0.9 % NaCl with KCl 20 mEq / L 75 mL/hr at 04/09/16 2043   PRN Meds:.acetaminophen **OR** acetaminophen, LORazepam, morphine injection, ondansetron (ZOFRAN) IV Allergies  Allergen Reactions  . Azithromycin   . Codeine Rash  . Erythromycin Nausea And Vomiting  . Penicillins Rash    Has patient had a PCN reaction causing immediate rash, facial/tongue/throat swelling, SOB or lightheadedness with hypotension: YES Has patient had a PCN reaction causing severe rash involving mucus membranes or skin necrosis: NO Has patient had a PCN reaction that required hospitalization NO Has patient had a PCN reaction occurring within the last 10 years: NO If all of the above answers are "NO", then may proceed with Cephalosporin use.    Review of Systems  -Exam limited due to patient lethargy and difficulty communicating. She has no complaints and consistently denies pain, anxiety, nausea, and discomfort.   Physical Exam    Constitutional: She appears lethargic. She has a sickly appearance.  Frail woman resting comfortably in bed  Eyes: EOM are normal.  Neck: Normal range of motion.  Pulmonary/Chest: Effort normal. No respiratory distress. She has no wheezes.  Abdominal: Soft.  Musculoskeletal: She exhibits no edema.  Neurological: She appears lethargic.  Lethargic, opens eyes briefly to voice and will attempt to answer a few simple questions. Speech is slurred and garbled.   Skin: Skin is warm and dry. There is pallor.  Psychiatric: Her speech is delayed and slurred. She is slowed.  Calm.   Vital Signs: BP (!) 161/58 (BP Location: Right Arm)   Pulse 78   Temp 97.5 F (36.4 C) (Oral)   Resp 20   Wt 59.5 kg (131 lb 2.8 oz)   SpO2 100%   BMI 24.79 kg/m  Pain Assessment: PAINAD     SpO2: SpO2: 100 % O2 Device:SpO2: 100 % O2 Flow Rate: .   IO: Intake/output summary: No intake or output data in the 24 hours ending 04/10/16 0820  LBM: Last BM Date: 04/08/16 Baseline Weight: Weight: 61.2 kg (135 lb) Most recent weight: Weight: 59.5 kg (131 lb 2.8 oz)     Palliative Assessment/Data: PPS 10-20%    Time Total: 70 minutes Greater than 50%  of this time was spent counseling and coordinating care related to the above assessment and plan.  Signed by: Murrell Converse, NP Palliative Medicine Team Pager # [phone] (M-F 7a-5p) Team Phone # [phone] (Nights/Weekends)

## 2016-04-10 NOTE — Care Management Note (Signed)
Case Management Note  Patient Details  Name: Rachel Holloway MRN: [id number] Date of Birth: 10/14/1935  Subjective/Objective:              [facility] Lives at home with spouse, who defers to daughter Rachel Holloway for medical decisions. Patient was seen by Palliative care and, per notation, family has opted for full comfort care. Disposition plan, at this time, is residential hospice. CM will continue to follow for discharge needs.       Action/Plan:   Expected Discharge Date:                  Expected Discharge Plan:     In-House Referral:     Discharge planning Services     Post Acute Care Choice:    Choice offered to:     DME Arranged:    DME Agency:     HH Arranged:    HH Agency:     Status of Service:     If discussed at [employer] Length of Stay Meetings, dates discussed:    Additional Comments:  Anda KraftRobarge, Cabrina Shiroma C, RN 04/10/2016, 9:45 AM

## 2016-04-10 NOTE — Progress Notes (Addendum)
Arrived from ED to room 465m04 at 2020. Minimally verbal. Not able to state name. Family at bedside.

## 2016-04-10 NOTE — Discharge Summary (Signed)
Physician Discharge Summary  Rachel Holloway ZOX:[id number] DOB: [birth year] DOA: 04/09/2016  PCP: Charlette Caffey, MD  Admit date: 04/09/2016 Discharge date: 04/10/2016  Admitted From: Home Disposition: Residential hospice  Recommendations for Outpatient Follow-up:  1. Residential hospice 2. Comfort feeds  Discharge Condition: Hospice CODE STATUS: DO NOT RESUSCITATE/DO NOT INTUBATE Diet recommendation: Comfort feeding   Brief/Interim Summary:  Admission HPI written by Eddie North, MD    Rachel Holloway  is a 81 y.o. female, With mild-to-moderate dementia, hypertension and hyperlipidemia brought to the ED for new onset slurred speech, confusion and urinary frequency. She was last known normal to be around 3 in the morning when she got up to go to the bathroom. Uses a walker to ambulate short distances at baseline. Husband found that she was sleeping until 9 in the morning which was unusual, poorly responding with slurred speech . Patient did not have fever, chills, complained of headache, nausea, vomiting, chest pain, shortness of breath, abdominal pain, diarrhea, recent illness, fall, recent travel or change in her medications.  In the ED vitals were stable. Blood work showed normal CBC, sodium of 146, K of 3.4 normal renal function. Head CT showed 1. Large volume acute subarachnoid hemorrhage throughout the basilar cisterns, bilateral sylvian fissures and medial bifrontal sulci. 2. Layering intraventricular hemorrhage in the occipital horns of the lateral ventricles.  Neurosurgeon Dr. Dutch Quint was consulted who spoke with patient's husband and daughter at bedside. Discussed options for ventriculostomy. Family after being explained the risks versus benefit, overall prognosis and possible complications decided against any intervention or aggressive measures and wanted her to be full comfort care.    Hospital course:  Subarachnoid hemorrhage Seen on CT scan. Goals of care were  discussed and decision made for full comfort care with extent of hemorrhage. Patient started on Keppra for seizure prophylaxis, which will be continued on discharge by hospice.  Hypokalemia Given fluids with potassium  Discharge Diagnoses:  Principal Problem:   Subarachnoid hemorrhage (HCC) Active Problems:   Dementia   Hypokalemia   SAH (subarachnoid hemorrhage) (HCC)    Discharge Instructions   Allergies as of 04/10/2016      Reactions   Azithromycin    Codeine Rash   Erythromycin Nausea And Vomiting   Penicillins Rash   Has patient had a PCN reaction causing immediate rash, facial/tongue/throat swelling, SOB or lightheadedness with hypotension: YES Has patient had a PCN reaction causing severe rash involving mucus membranes or skin necrosis: NO Has patient had a PCN reaction that required hospitalization NO Has patient had a PCN reaction occurring within the last 10 years: NO If all of the above answers are "NO", then may proceed with Cephalosporin use.      Medication List    STOP taking these medications   aspirin 81 MG tablet   atorvastatin 80 MG tablet Commonly known as:  LIPITOR   donepezil 10 MG tablet Commonly known as:  ARICEPT   lisinopril 10 MG tablet Commonly known as:  PRINIVIL,ZESTRIL   memantine 10 MG tablet Commonly known as:  NAMENDA   rivastigmine 4.6 mg/24hr Commonly known as:  EXELON   Vitamin D (Ergocalciferol) 50000 units Caps capsule Commonly known as:  DRISDOL     TAKE these medications   bisacodyl 10 MG suppository Commonly known as:  DULCOLAX Place 1 suppository (10 mg total) rectally daily as needed for moderate constipation.   glycopyrrolate 0.2 MG/ML injection Commonly known as:  ROBINUL Inject 0.5 mLs (0.1 mg total) into the vein  every 4 (four) hours as needed (secretions).   levETIRAcetam 500 mg in sodium chloride 0.9 % 100 mL Inject 500 mg into the vein every 12 (twelve) hours.   LORazepam 2 MG/ML injection Commonly  known as:  ATIVAN Inject 0.25 mLs (0.5 mg total) into the vein every 4 (four) hours as needed for seizure (agitation).   morphine 2 MG/ML injection Inject 0.5 mLs (1 mg total) into the vein every 2 (two) hours as needed (shortness of breath).   ondansetron 4 MG/2ML Soln injection Commonly known as:  ZOFRAN Inject 2 mLs (4 mg total) into the vein every 6 (six) hours as needed for nausea or vomiting.       Allergies  Allergen Reactions  . Azithromycin   . Codeine Rash  . Erythromycin Nausea And Vomiting  . Penicillins Rash    Has patient had a PCN reaction causing immediate rash, facial/tongue/throat swelling, SOB or lightheadedness with hypotension: YES Has patient had a PCN reaction causing severe rash involving mucus membranes or skin necrosis: NO Has patient had a PCN reaction that required hospitalization NO Has patient had a PCN reaction occurring within the last 10 years: NO If all of the above answers are "NO", then may proceed with Cephalosporin use.     Consultations:  Neurosurgery  Palliative care medicine   Procedures/Studies: Ct Head Wo Contrast  Result Date: 04/09/2016 CLINICAL DATA:  Slurred speech.  Unresponsive. EXAM: CT HEAD WITHOUT CONTRAST TECHNIQUE: Contiguous axial images were obtained from the base of the skull through the vertex without intravenous contrast. COMPARISON:  02/25/2014 brain MRI. FINDINGS: Brain: There is large volume acute subarachnoid hemorrhage throughout the medial bifrontal sulci, bilateral sylvian fissures, bilateral perimesencephalic cisterns, interpeduncular cistern and suprasellar cistern. There is layering acute hemorrhage in the occipital horns of both lateral ventricles. There is symmetric dilatation of the lateral ventricles and of the third ventricle. Prominent periventricular white matter hypodensity bilaterally. No significant midline shift. Generalized mild cerebral volume loss. Vascular: No hyperdense vessel or unexpected  calcification. Skull: No evidence of calvarial fracture. Sinuses/Orbits: The visualized paranasal sinuses are essentially clear. Other:  The mastoid air cells are unopacified. IMPRESSION: 1. Large volume acute subarachnoid hemorrhage throughout the basilar cisterns, bilateral sylvian fissures and medial bifrontal sulci. 2. Layering intraventricular hemorrhage in the occipital horns of the lateral ventricles. 3. 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 4. No significant midline shift. Critical Value/emergent results were called by telephone at the time of interpretation on 04/09/2016 at 4:40 pm to Dr. Alona BeneJOSHUA LONG , who verbally acknowledged these results. Electronically Signed   By: Delbert PhenixJason A Poff M.D.   On: 04/09/2016 16:42   Dg Chest Portable 1 View  Result Date: 04/09/2016 CLINICAL DATA:  Slurred speech. EXAM: PORTABLE CHEST 1 VIEW COMPARISON:  No recent prior. FINDINGS: Mediastinum and hilar structures are normal. Mild left base subsegmental atelectasis and/or scarring. Calcified nodular density noted in the right lung base consistent with granuloma. Biapical pleural-parenchymal thickening consistent with scarring. IMPRESSION: Mild left base subsegmental atelectasis and/or scarring. Mild biapical pleuroparenchymal thickening consistent with scarring. Electronically Signed   By: Maisie Fushomas  Register   On: 04/09/2016 16:18      Subjective: No pain.  Discharge Exam: Vitals:   04/10/16 0849 04/10/16 1000  BP: (!) 175/73 (!) 166/69  Pulse: 85 82  Resp: 20 20  Temp: 98.4 F (36.9 C) 98.4 F (36.9 C)   Vitals:   04/10/16 0400 04/10/16 0600 04/10/16 0849 04/10/16 1000  BP: (!) 155/59 (!) 161/58 (!) 175/73 (!) 166/69  Pulse: 78  85 82  Resp: 20 20 20 20   Temp:  97.5 F (36.4 C) 98.4 F (36.9 C) 98.4 F (36.9 C)  TempSrc:  Oral Axillary Axillary  SpO2: 97% 100% 96% 100%  Weight:        General: Pt is somnolent but  awakens to physical arousal attempts. No distress. Periods of apnea for ~10 seconds Cardiovascular: RRR, S1/S2 +, no rubs, no gallops Respiratory: CTA bilaterally, no wheezing, no rhonchi Abdominal: Soft, NT, ND, bowel sounds + Extremities: no edema, no cyanosis    The results of significant diagnostics from this hospitalization (including imaging, microbiology, ancillary and laboratory) are listed below for reference.     Microbiology: Recent Results (from the past 240 hour(s))  MRSA PCR Screening     Status: None   Collection Time: 04/10/16  6:34 AM  Result Value Ref Range Status   MRSA by PCR NEGATIVE NEGATIVE Final    Comment:        The GeneXpert MRSA Assay (FDA approved for NASAL specimens only), is one component of a comprehensive MRSA colonization surveillance program. It is not intended to diagnose MRSA infection nor to guide or monitor treatment for MRSA infections.      Labs: BNP (last 3 results) No results for input(s): BNP in the last 8760 hours. Basic Metabolic Panel:  Recent Labs Lab 04/09/16 1521  NA 146*  K 3.4*  CL 111  CO2 24  GLUCOSE 113*  BUN 12  CREATININE 1.01*  CALCIUM 9.4   Liver Function Tests:  Recent Labs Lab 04/09/16 1521  AST 26  ALT 21  ALKPHOS 76  BILITOT 0.7  PROT 6.8  ALBUMIN 3.7   No results for input(s): LIPASE, AMYLASE in the last 168 hours. No results for input(s): AMMONIA in the last 168 hours. CBC:  Recent Labs Lab 04/09/16 1521  WBC 7.6  NEUTROABS 6.0  HGB 13.2  HCT 39.8  MCV 100.5*  PLT 213   Cardiac Enzymes: No results for input(s): CKTOTAL, CKMB, CKMBINDEX, TROPONINI in the last 168 hours. BNP: Invalid input(s): POCBNP CBG:  Recent Labs Lab 04/09/16 1502  GLUCAP 93   D-Dimer No results for input(s): DDIMER in the last 72 hours. Hgb A1c No results for input(s): HGBA1C in the last 72 hours. Lipid Profile No results for input(s): CHOL, HDL, LDLCALC, TRIG, CHOLHDL, LDLDIRECT in the last 72  hours. Thyroid function studies No results for input(s): TSH, T4TOTAL, T3FREE, THYROIDAB in the last 72 hours.  Invalid input(s): FREET3 Anemia work up No results for input(s): VITAMINB12, FOLATE, FERRITIN, TIBC, IRON, RETICCTPCT in the last 72 hours. Urinalysis    Component Value Date/Time   COLORURINE STRAW (A) 04/09/2016 1655   APPEARANCEUR CLEAR 04/09/2016 1655   LABSPEC 1.009 04/09/2016 1655   PHURINE 7.0 04/09/2016 1655   GLUCOSEU NEGATIVE 04/09/2016 1655   HGBUR NEGATIVE 04/09/2016 1655   BILIRUBINUR NEGATIVE 04/09/2016 1655   KETONESUR NEGATIVE 04/09/2016 1655   PROTEINUR NEGATIVE 04/09/2016 1655   UROBILINOGEN 0.2 11/26/2012 0724   NITRITE NEGATIVE 04/09/2016 1655   LEUKOCYTESUR NEGATIVE 04/09/2016 1655   Sepsis Labs Invalid input(s): PROCALCITONIN,  WBC,  LACTICIDVEN Microbiology Recent Results (from the past 240 hour(s))  MRSA PCR Screening     Status: None   Collection Time: 04/10/16  6:34 AM  Result Value Ref Range Status   MRSA by PCR NEGATIVE NEGATIVE Final    Comment:  The GeneXpert MRSA Assay (FDA approved for NASAL specimens only), is one component of a comprehensive MRSA colonization surveillance program. It is not intended to diagnose MRSA infection nor to guide or monitor treatment for MRSA infections.      SIGNED:   Jacquelin Hawking, MD [facility] 04/10/2016, 12:57 PM Pager [phone]  If 7PM-7AM, please contact night-coverage [email or web address] Password [email or web address]

## 2016-04-10 NOTE — Progress Notes (Signed)
Please have staff prepare patient for transport via [facility] before 1000 04/11/16.  [facility] will have 1000 transport time for patient to travel to [facility].

## 2016-04-10 NOTE — Clinical Social Work Note (Signed)
Consult made to CSW regarding residential hospice placement and referral made to [facility] (daughter's preference). Ms. Rachel Holloway will discharge to [facility] facility on Wednesday, 3/14.  Genelle BalVanessa Enslee Bibbins, MSW, LCSW Licensed Clinical Social Worker Clinical Social Work Department [facility] [phone]

## 2016-04-11 DIAGNOSIS — Z515 Encounter for palliative care: Secondary | ICD-10-CM

## 2016-04-11 NOTE — Progress Notes (Addendum)
Report given to ClatoniaGina, per Almira CoasterGina, keep IVs in place

## 2016-04-11 NOTE — Clinical Social Work Note (Addendum)
Patient will discharge [facility] today, transported by ambulance. Daughter Beth aware of discharge and advised of 10 am ambulance pick-up time. Patient's nurse will call report to Hospice nurse. CSW signing off, however please advise if any needs before patient leaves the hospital.  Genelle BalVanessa Rebeccah Ivins, MSW, LCSW Licensed Clinical Social Worker Clinical Social Work Department [facility] [phone]

## 2016-04-11 NOTE — Progress Notes (Signed)
Daily Progress Note   Patient Name: Rachel Holloway       Date: 04/11/2016 DOB: February 05, 1935  Age: 81 y.o. MRN#: [id number] Attending Physician: Velvet Bathe, MD Primary Care Physician: Colonel Bald, MD Admit Date: 04/09/2016  Reason for Consultation/Follow-up: Terminal Care  Subjective: Rachel Holloway is similar to yesterday: she briefly opens her eyes and says a few words, then quickly falls back to sleep. She was able to tell me she felt weak and tired, but did not answer any questions about pain, nausea, or anxiety. She does appear comfortable in bed with no signs of discomfort or distress. Her daughter was asleep at her bedside.   Length of Stay: 2  Current Medications: Scheduled Meds:  . levETIRAcetam  500 mg Intravenous Q12H    Continuous Infusions: . 0.9 % NaCl with KCl 20 mEq / L 75 mL/hr at 04/10/16 2233    PRN Meds: acetaminophen **OR** acetaminophen, bisacodyl, glycopyrrolate, LORazepam, morphine injection, ondansetron (ZOFRAN) IV  Physical Exam    Constitutional: She appears lethargic. She has a sickly appearance.  Frail woman resting comfortably in bed  Eyes: EOM are normal.  Neck: Normal range of motion.  Pulmonary/Chest: Effort normal. No respiratory distress. She has no wheezes.  Abdominal: Soft.  Musculoskeletal: She exhibits no edema.  Neurological: She appears lethargic.  Lethargic, opens eyes briefly to voice and will attempt to answer a few simple questions. Unable to stay awake for more than a few seconds. Skin: Skin is warm and dry. There is pallor.  Psychiatric: Her speech is delayed and slurred. She is slowed.  Calm.        Vital Signs: BP (!) 183/46 (BP Location: Left Arm)   Pulse (!) 57   Temp 97.8 F (36.6 C) (Oral)   Resp 20   Wt 59.5 kg (131 lb 2.8 oz)   SpO2 99%   BMI 24.79 kg/m  SpO2: SpO2: 99  % O2 Device: O2 Device: Not Delivered O2 Flow Rate:    Intake/output summary:  Intake/Output Summary (Last 24 hours) at 04/11/16 0751 Last data filed at 04/11/16 9242  Gross per 24 hour  Intake              925 ml  Output                0 ml  Net              925 ml   LBM: Last BM Date: 04/08/16 Baseline Weight: Weight: 61.2 kg (135 lb) Most recent weight: Weight: 59.5 kg (131 lb 2.8 oz)  Palliative Assessment/Data: PPS 10-20%   Flowsheet Rows   Flowsheet Row Most Recent Value  Intake Tab  Referral Department  -- [ED]  Unit at Time of Referral  ER  Palliative Care Primary Diagnosis  Neurology  Date Notified  04/09/16  Palliative Care Type  New Palliative care  Reason for referral  Clarify Goals of Care  Date of Admission  04/09/16  Date first seen by Palliative Care  04/10/16  # of days Palliative referral response time  1 Day(s)  # of days IP prior to Palliative referral  0  Clinical Assessment  Psychosocial & Spiritual Assessment  Palliative  Care Outcomes      Patient Active Problem List   Diagnosis Date Noted  . Goals of care, counseling/discussion   . Palliative care by specialist   . Subarachnoid hemorrhage ([place]) 04/09/2016  . Hypokalemia 04/09/2016  . SAH (subarachnoid hemorrhage) ([place]) 04/09/2016  . Orthostatic hypotension 11/07/2015  . Lumbago 03/04/2014  . Gait difficulty 03/04/2014  . Dementia 02/11/2014    Palliative Care Assessment & Plan   HPI: 81 y.o. female  with past medical history of mild to moderate dementia, HTN, and HLD. She presented to the ER after her husband found her to have slurred speech and poorly responding after waking her up. She was subsequently admitted on 04/09/2016 when CT head showed a large volume acute subarachnoid hemorrhage, layering intraventricular hemorrhage, and probably early communicating hydrocephalus. Neurology consulted and discussed results with family (husband and daughter); family decided against aggressive  measures and opted for full comfort care. Palliative consulted to assist in supporting comfort care and disposition.   Assessment: I met with Rachel Holloway and her daughter Rachel Holloway on 3/13. Rachel Holloway was not able to participate in our conversation due to lethargy. Rachel Holloway and I had a long conversation about her mother, with Rachel Holloway explaining that her goals of care have always been centered around comfort, and Rachel Holloway would not want aggressive or invasive interventions. After speaking with the Neurologist, they had decided to pursue full comfort care. We discussed disposition options, with Rachel Holloway deciding to pursue residential Hospice in [place]. Hospice was contacted, and Rachel Holloway is to be transferred to their facility later this morning.   Today, I revisited Rachel Holloway to ensure she remained comfortable and had no increased symptom burden. On my arrival to the room she was awake, and socially greeted me. I asked how she was feeling and she related weakness and fatigue. I offered her water and food, but she declined. She fell back asleep before responding about pain, anxiety, or nausea. In my assessment, however, she did appear comfortable. No behavioral or physical signs of discomfort or distress.   Recommendations/Plan:  DNR, full comfort care  Plan to transition to [facility] this morning  No uncontrolled symptoms, she appears comfortable and calm  Goals of Care and Additional Recommendations:  Limitations on Scope of Treatment: Full Comfort Care  Code Status:  DNR  Prognosis:   < 2 weeks; Rachel Holloway has minimal oral intake (a few sips of water, no food) and is increasingly lethargic.   Discharge Planning:  Hospice facility  Care plan was discussed with pt. Daughter asleep at her bedside, I did not wake her.  Thank you for allowing the Palliative Medicine Team to assist in the care of this patient.  Total time: 15 minutes    Greater than 50%  of this time was spent counseling and coordinating care related  to the above assessment and plan.  Charlynn Court, NP Palliative Medicine Team [phone] pager (7a-5p) Team Phone # [phone]

## 2016-04-29 DEATH — deceased

## 2016-05-03 ENCOUNTER — Telehealth: Payer: Self-pay | Admitting: Nurse Practitioner

## 2016-05-03 NOTE — Telephone Encounter (Signed)
Pt's husband called, c/a appt on 05/07/16, said she passed 03/31/2016.   FYI

## 2016-05-07 ENCOUNTER — Ambulatory Visit: Payer: Medicare Other | Admitting: Neurology

## 2017-09-01 IMAGING — CT CT HEAD W/O CM
3 of 4 series · 17 of 47 positions shown, 20 images · non-contrast
Comparison: 02/25/2014 brain MRI.

CLINICAL DATA: Slurred speech.  Unresponsive.

EXAM:
CT HEAD WITHOUT CONTRAST
TECHNIQUE: Contiguous axial images were obtained from the base of the skull
through the vertex without intravenous contrast.

[Series 201: head w/o, idose (1) · axial · non-contrast · 0.42mm/px · z∈[+126,+256]mm · 11 of 32 slices shown, 14 images]
[im 3/32  brain]
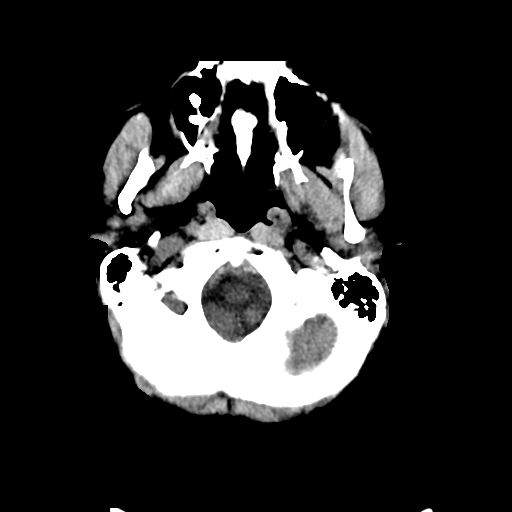
[im 3/32  bone]
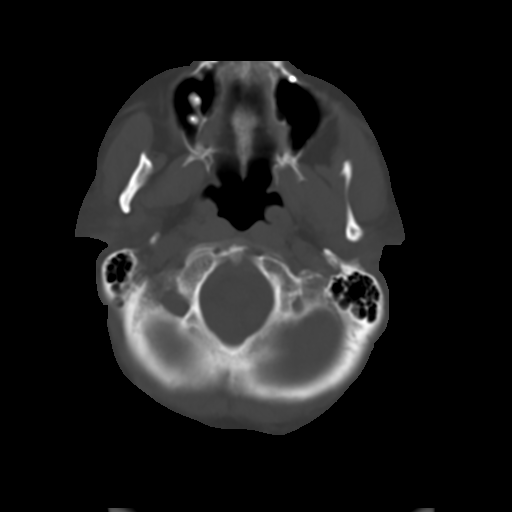
[im 5/32  brain]
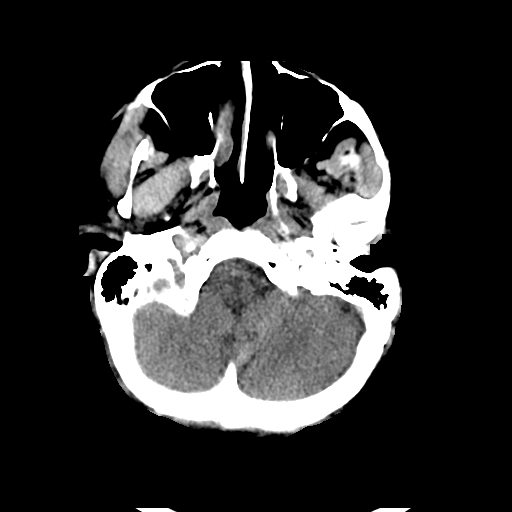
[im 7/32  brain]
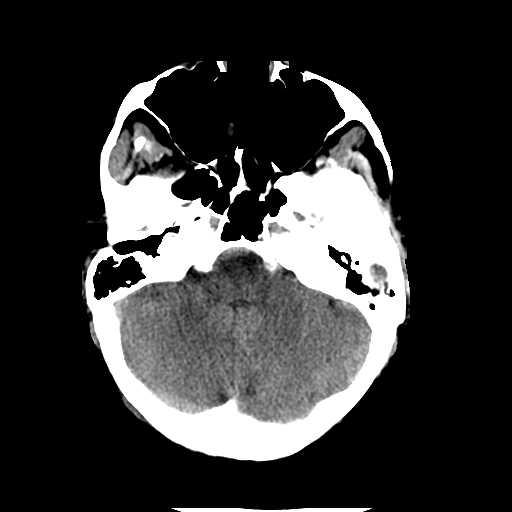
[im 12/32  brain]
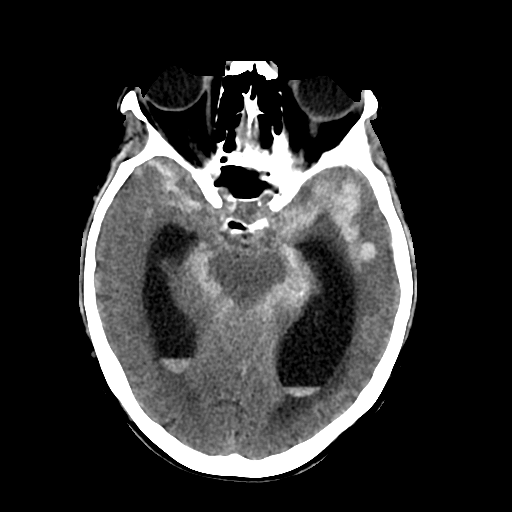
[im 14/32  brain]
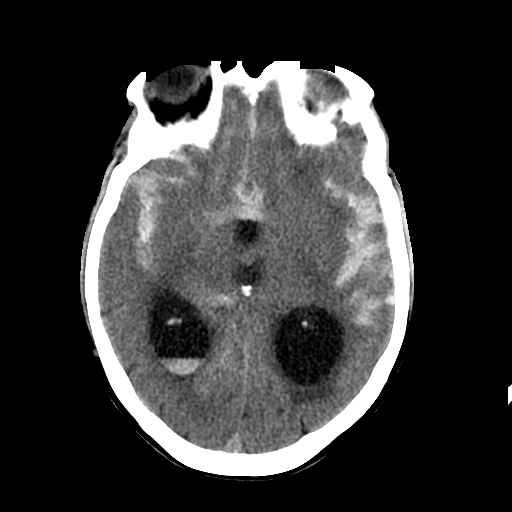
[im 14/32  bone]
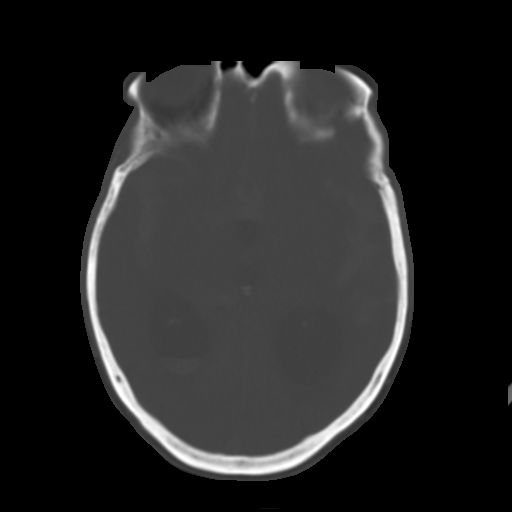
[im 16/32  brain]
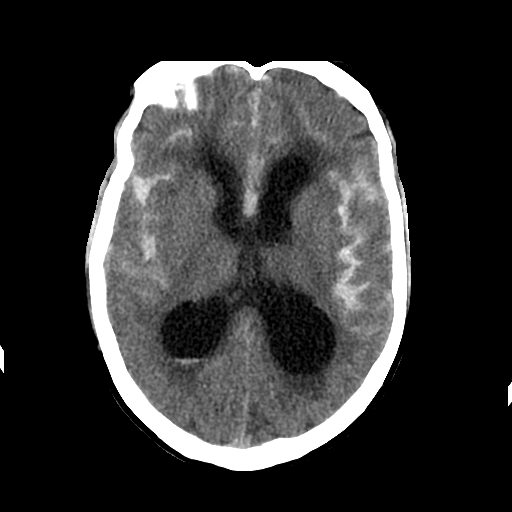
[im 18/32  brain]
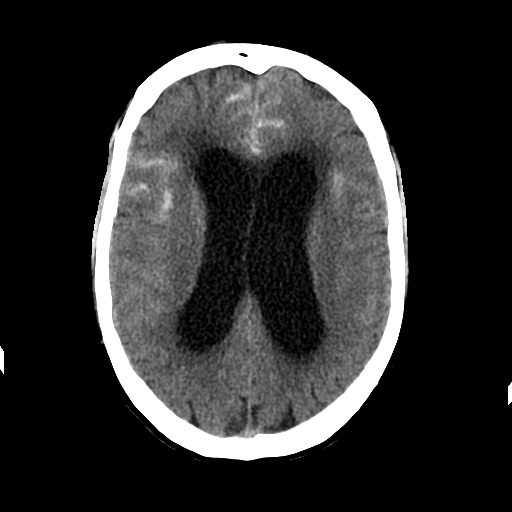
[im 20/32  brain]
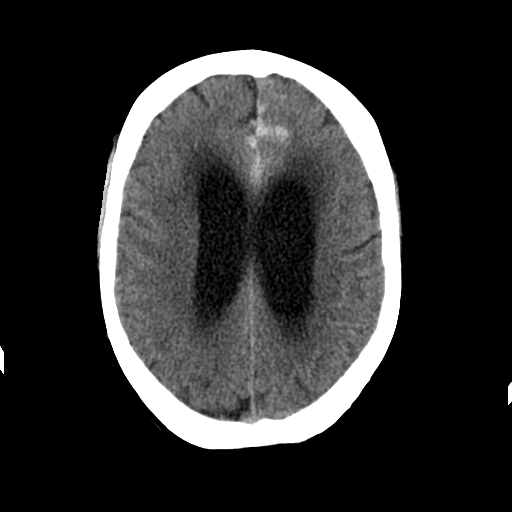
[im 25/32  brain]
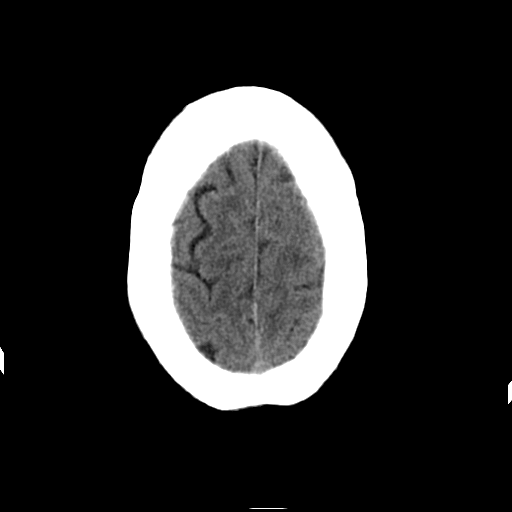
[im 25/32  bone]
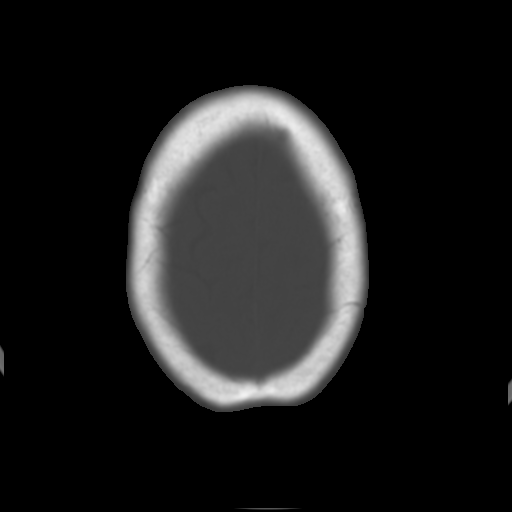
[im 27/32  brain]
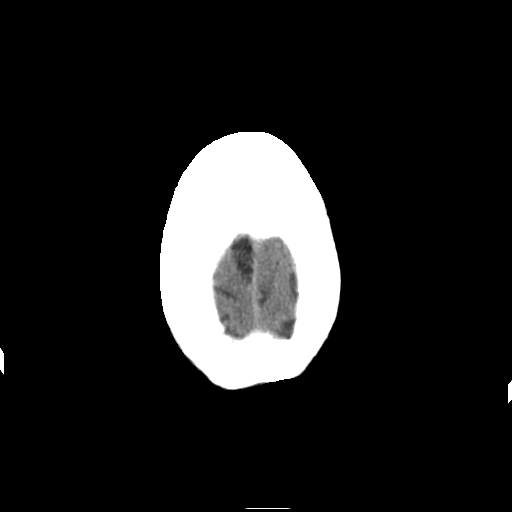
[im 29/32  brain]
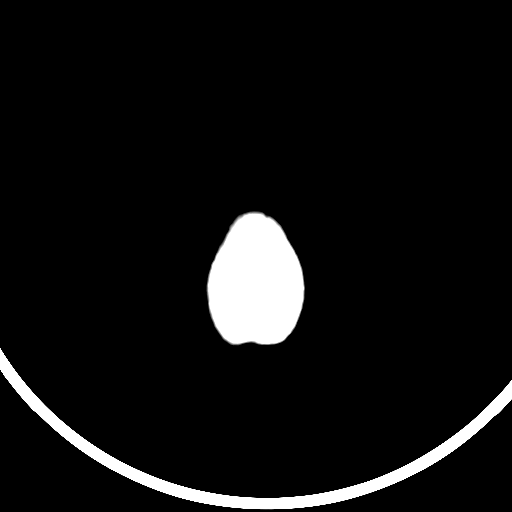

[Series 203: coronal st, idose (1) · coronal · 0.42mm/px · 3 of 68 slices shown]
[im 23/68  brain]
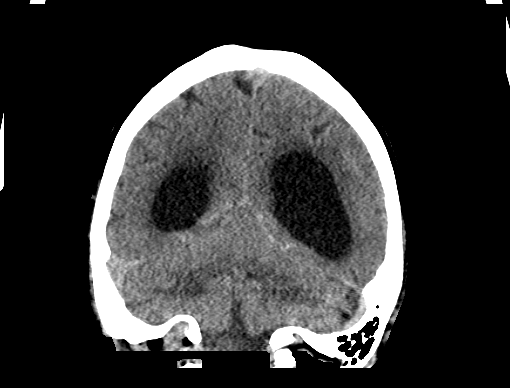
[im 30/68  brain]
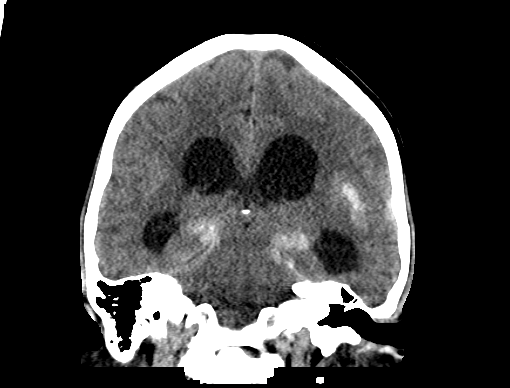
[im 38/68  brain]
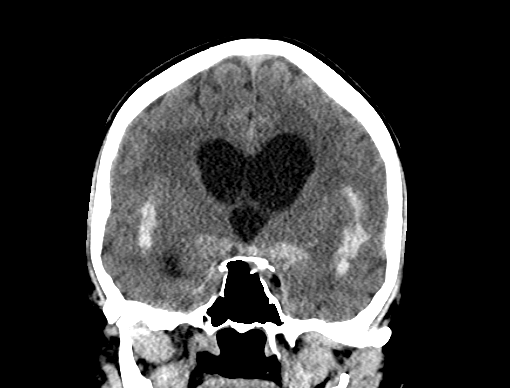

[Series 204: sagittal st, idose (1) · sagittal · 0.40mm/px · 3 of 71 slices shown]
[im 24/71  brain]
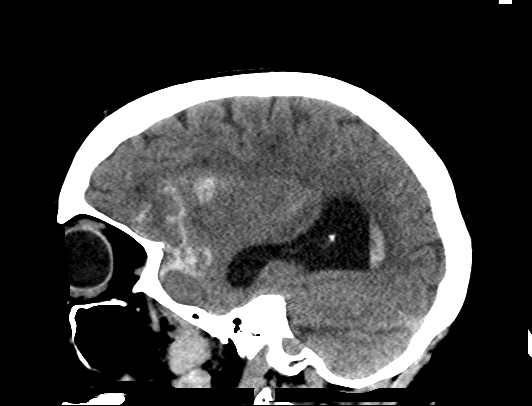
[im 36/71  brain]
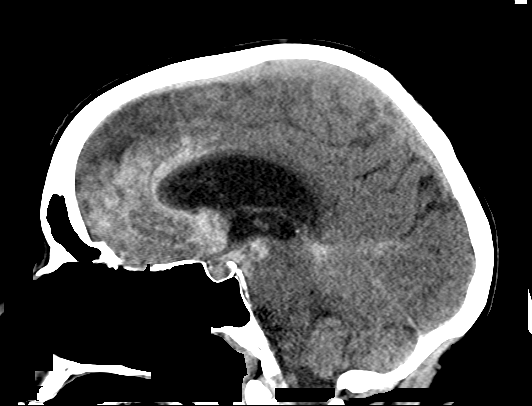
[im 47/71  brain]
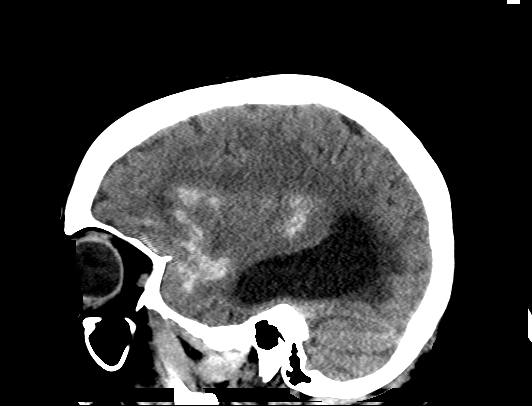

[17 of 47 positions shown; findings below may reference images not displayed]

FINDINGS: Brain: There is large volume acute subarachnoid hemorrhage
throughout the medial bifrontal sulci, bilateral sylvian fissures,
bilateral perimesencephalic cisterns, interpeduncular cistern and
suprasellar cistern. There is layering acute hemorrhage in the
occipital horns of both lateral ventricles. There is symmetric
dilatation of the lateral ventricles and of the third ventricle.
Prominent periventricular white matter hypodensity bilaterally. No
significant midline shift. Generalized mild cerebral volume loss.

Vascular: No hyperdense vessel or unexpected calcification.

Skull: No evidence of calvarial fracture.

Sinuses/Orbits: The visualized paranasal sinuses are essentially
clear.

Other:  The mastoid air cells are unopacified.
IMPRESSION: 1. Large volume acute subarachnoid hemorrhage throughout the basilar
cisterns, bilateral sylvian fissures and medial bifrontal sulci.
2. Layering intraventricular hemorrhage in the occipital horns of
the lateral ventricles.
3. Probable early communicating hydrocephalus with transependymal
CSF flow and/or chronic small vessel ischemic change.
4. No significant midline shift.

Critical Value/emergent results were called by telephone at the time
of interpretation on 04/09/2016 at [DATE] to Dr. GLORI AUJLA , who
verbally acknowledged these results.
